# Patient Record
Sex: Female | Born: 1945 | Race: White | Hispanic: No | Marital: Married | Smoking: Former smoker
Health system: Southern US, Community
[De-identification: ages and names within clinical notes are randomized; demographics above are authoritative.]

## PROBLEM LIST (undated history)

## (undated) DIAGNOSIS — E785 Hyperlipidemia, unspecified: Secondary | ICD-10-CM

## (undated) DIAGNOSIS — M1990 Unspecified osteoarthritis, unspecified site: Secondary | ICD-10-CM

## (undated) DIAGNOSIS — G510 Bell's palsy: Secondary | ICD-10-CM

## (undated) DIAGNOSIS — R7301 Impaired fasting glucose: Secondary | ICD-10-CM

## (undated) DIAGNOSIS — E039 Hypothyroidism, unspecified: Secondary | ICD-10-CM

## (undated) DIAGNOSIS — I639 Cerebral infarction, unspecified: Secondary | ICD-10-CM

## (undated) DIAGNOSIS — H409 Unspecified glaucoma: Secondary | ICD-10-CM

## (undated) HISTORY — PX: EYE SURGERY: SHX253

## (undated) HISTORY — PX: THYROIDECTOMY: SHX17

## (undated) HISTORY — DX: Hyperlipidemia, unspecified: E78.5

## (undated) HISTORY — DX: Bell's palsy: G51.0

## (undated) HISTORY — DX: Unspecified glaucoma: H40.9

## (undated) HISTORY — DX: Hypothyroidism, unspecified: E03.9

## (undated) HISTORY — DX: Impaired fasting glucose: R73.01

---

## 1988-08-17 HISTORY — PX: OTHER SURGICAL HISTORY: SHX169

## 2005-05-20 ENCOUNTER — Ambulatory Visit: Payer: Self-pay | Admitting: Ophthalmology

## 2006-10-06 ENCOUNTER — Ambulatory Visit: Payer: Self-pay | Admitting: Nurse Practitioner

## 2008-12-06 ENCOUNTER — Ambulatory Visit: Payer: Self-pay | Admitting: Nurse Practitioner

## 2010-07-31 ENCOUNTER — Emergency Department: Payer: Self-pay | Admitting: Internal Medicine

## 2011-01-29 ENCOUNTER — Ambulatory Visit: Payer: Self-pay | Admitting: Family Medicine

## 2011-04-28 IMAGING — CT CT STONE STUDY
1 of 2 series · 15 of 32 positions shown, 19 images · non-contrast
Comparison: none

REASON FOR EXAM: ab dpain
COMMENTS:

[Series 2: stone · axial · 0.63mm/px · z∈[-944,-594]mm · 15 of 132 slices shown, 19 images]
[im 10/132  soft-tissue]
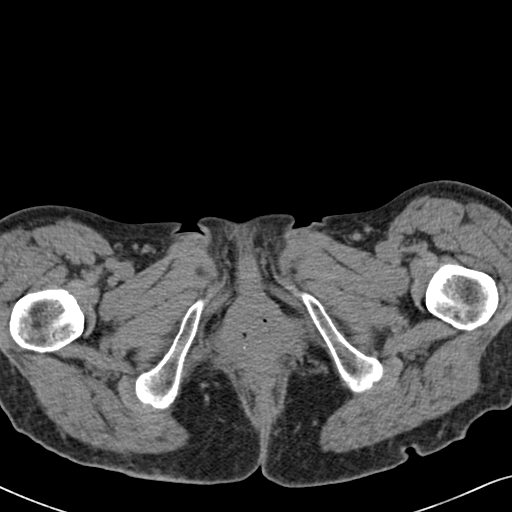
[im 10/132  bone]
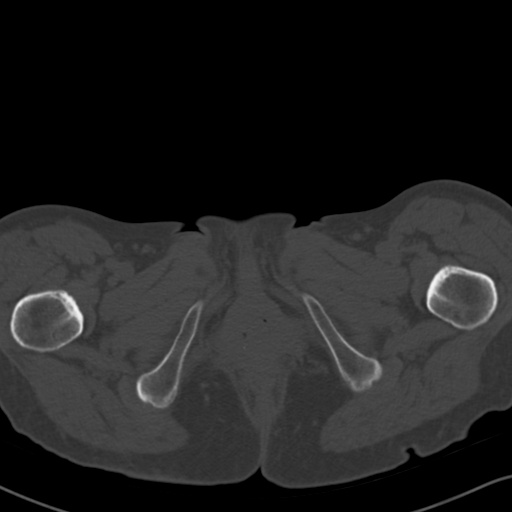
[im 19/132  soft-tissue]
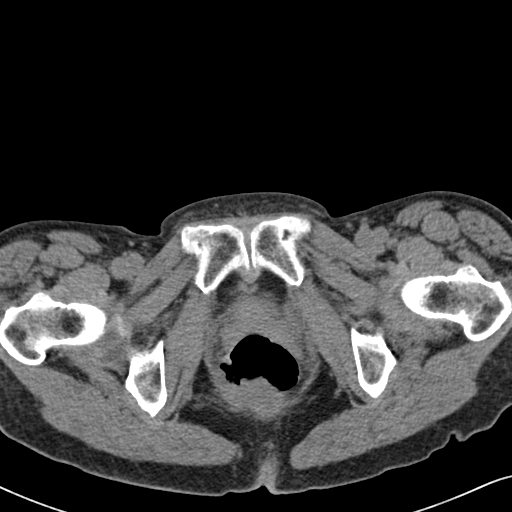
[im 29/132  soft-tissue]
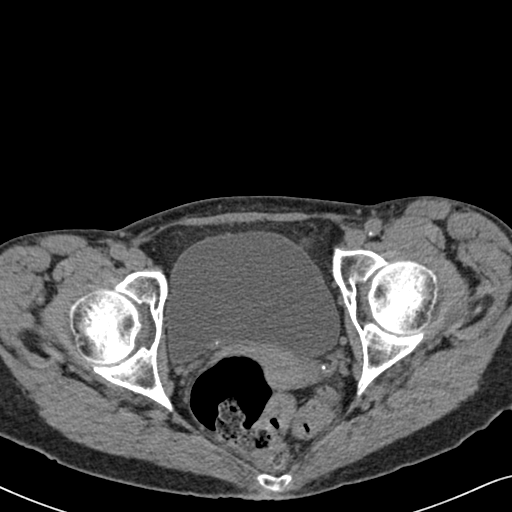
[im 38/132  soft-tissue]
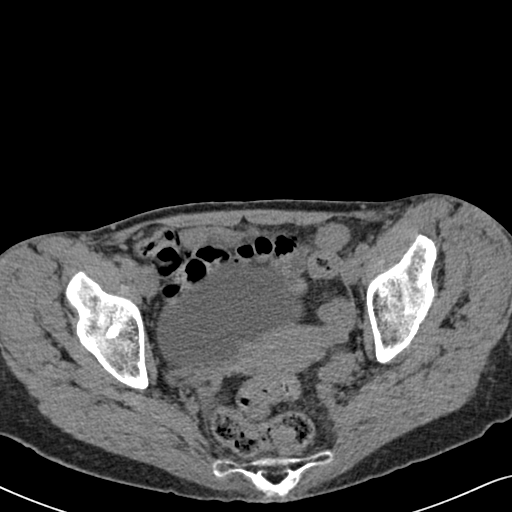
[im 47/132  soft-tissue]
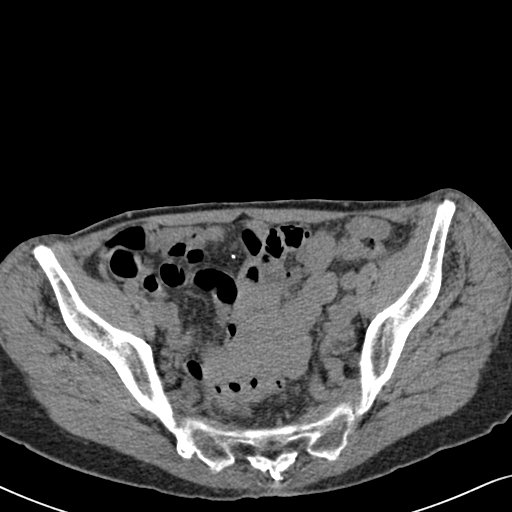
[im 57/132  soft-tissue]
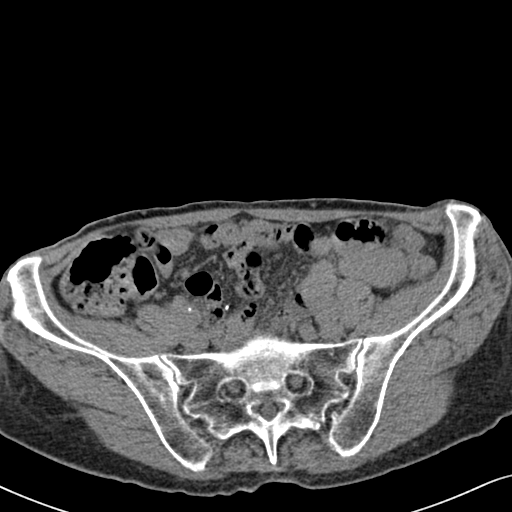
[im 66/132  soft-tissue]
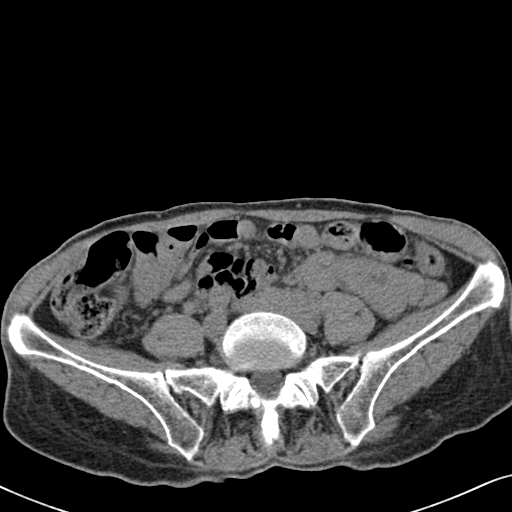
[im 75/132  soft-tissue]
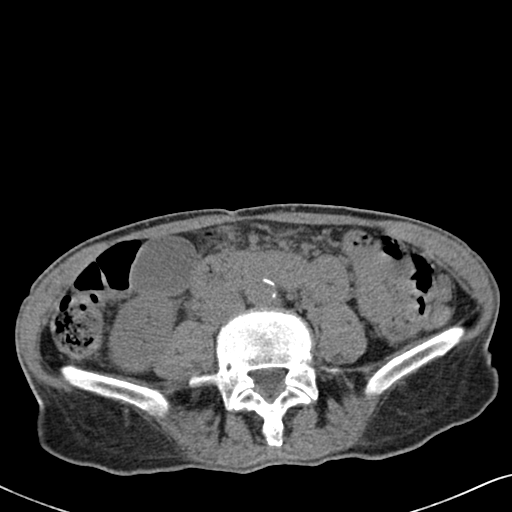
[im 85/132  soft-tissue]
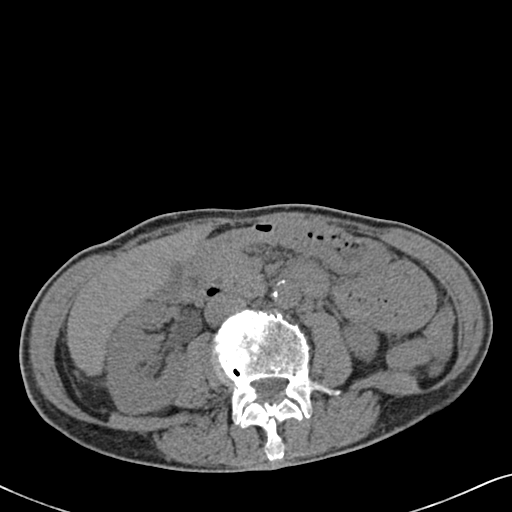
[im 85/132  bone]
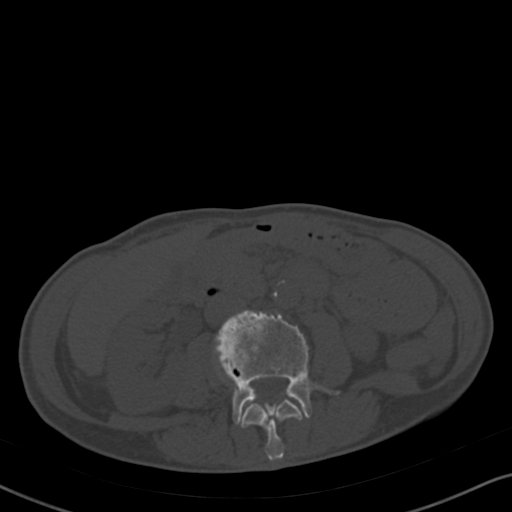
[im 94/132  soft-tissue]
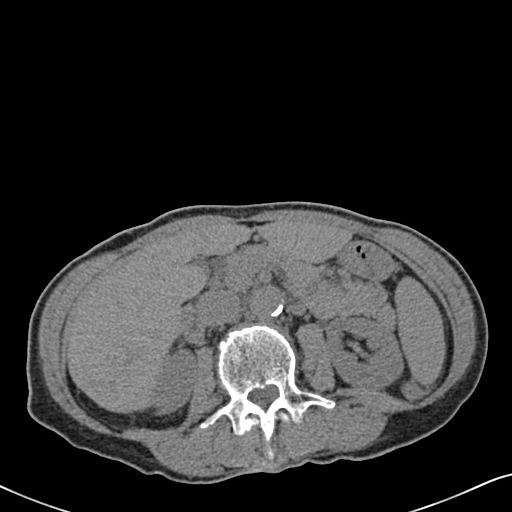
[im 103/132  soft-tissue]
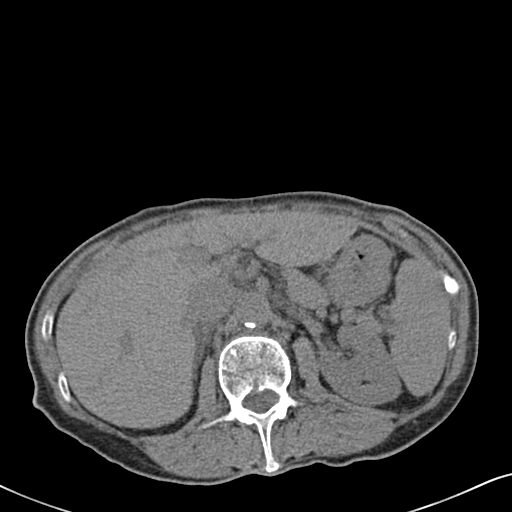
[im 113/132  soft-tissue]
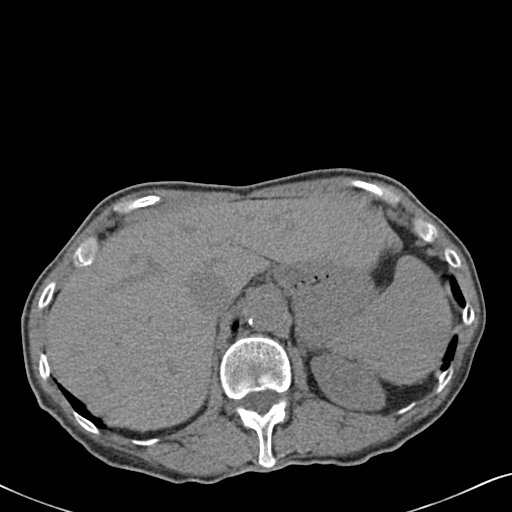
[im 113/132  lung]
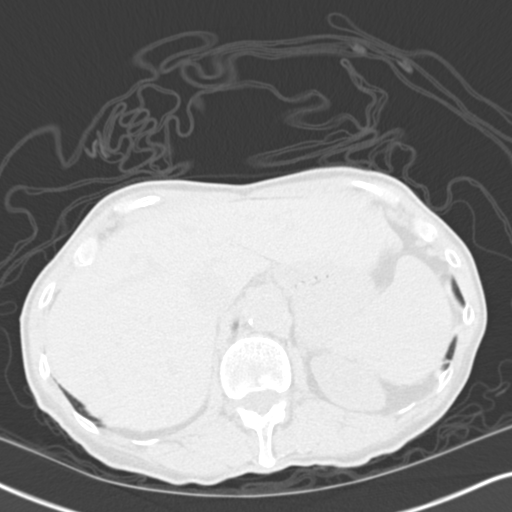
[im 117/132  lung]
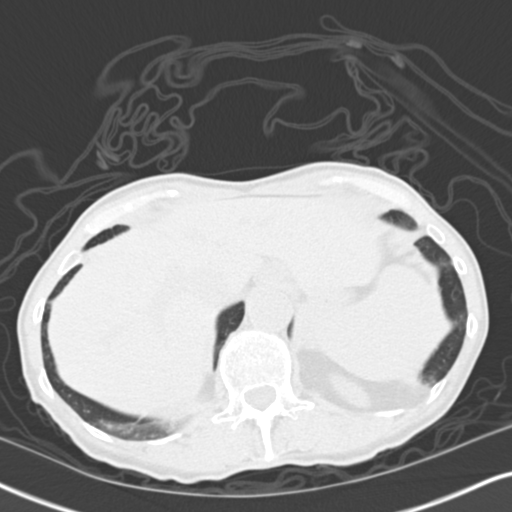
[im 122/132  soft-tissue]
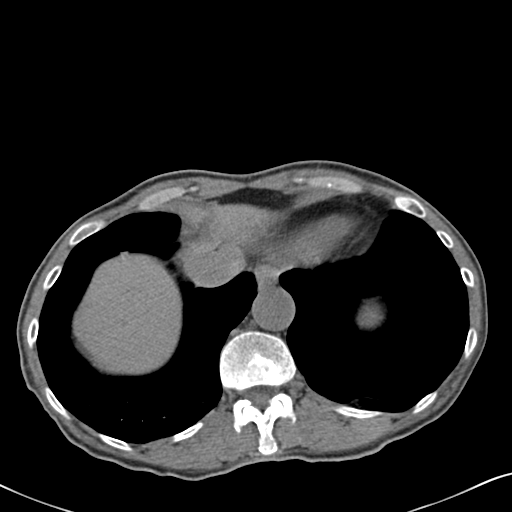
[im 122/132  lung]
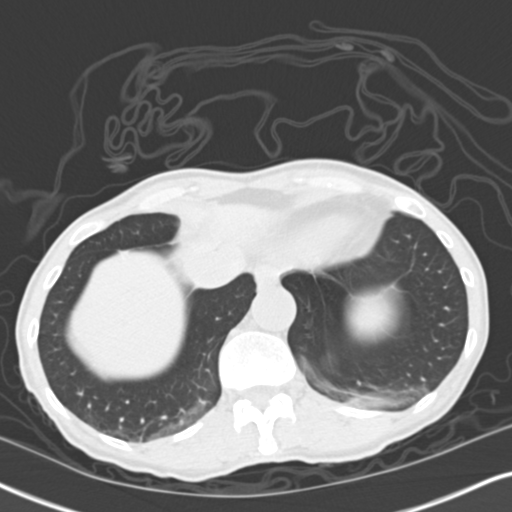
[im 127/132  lung]
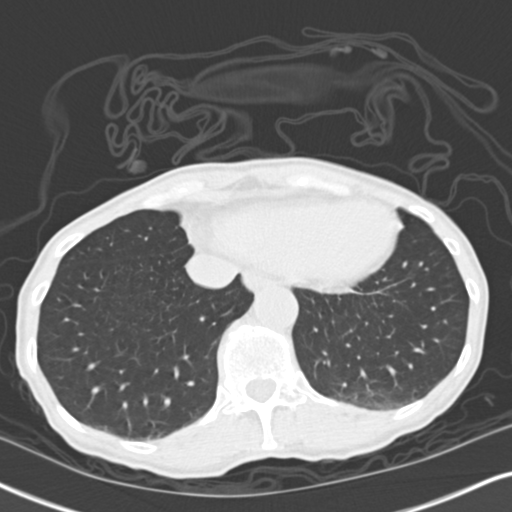

[15 of 32 positions shown; findings below may reference images not displayed]

PROCEDURE:     CT  - CT ABDOMEN /PELVIS WO (STONE)  - July 31, 2010 [DATE]

RESULT:     Axial noncontrast CT scanning was performed through the abdomen
and pelvis at 3 mm intervals and slice thicknesses. Review of multiplanar
reconstructed images was performed separately on the VIA monitor.

The kidneys are normal in contour. There is mild hydronephrosis on the right
with mild hydroureter. This is related to an approximately 1 mm diameter
stone at the right ureterovesical junction visible on image 104. I do not
see stones elsewhere in the right kidney. I see no stones in the left kidney
nor evidence of obstruction.

The liver, gallbladder, spleen, nondistended stomach, right adrenal gland,
and periaortic and pericaval regions exhibit no acute abnormality. There is
mild fullness of the left adrenal gland. The caliber of the abdominal aorta
is normal. The unopacified loops of small and large bowel are grossly
normal. The uterus and adnexal structures exhibit no acute.

The lung bases exhibit minimal compressive atelectasis in the posterior
costophrenic gutters. There are degenerative changes of the L3-L4 disc.
IMPRESSION: 1. There is mild hydronephrosis and hydroureter on the right secondary to a
1 mm diameter distal right ureteral stone.
2. Evaluation of the remainder of the abdominal viscera is limited without
oral or intravenous contrast material. No gross abnormality is identified.
3. There is minimal compressive atelectasis at both lung bases posteriorly.

## 2011-06-09 ENCOUNTER — Ambulatory Visit: Payer: Self-pay | Admitting: Family Medicine

## 2012-03-06 IMAGING — US US CAROTID DUPLEX BILAT
1 series · 17 of 24 positions shown · non-contrast
Comparison: none

REASON FOR EXAM: syncope orthostatic hypotension
COMMENTS:

PROCEDURE:     US  - US CAROTID DOPPLER BILATERAL  - June 09, 2011 [DATE]
RESULT:
TECHNIQUE: Grayscale, Duplex Doppler and color flow and SPECTRAL waveform
imaging was performed of the right and left carotid systems.

[Series 1: us carotid duplex bilat · 17 of 69 slices shown]
[im 1/69]
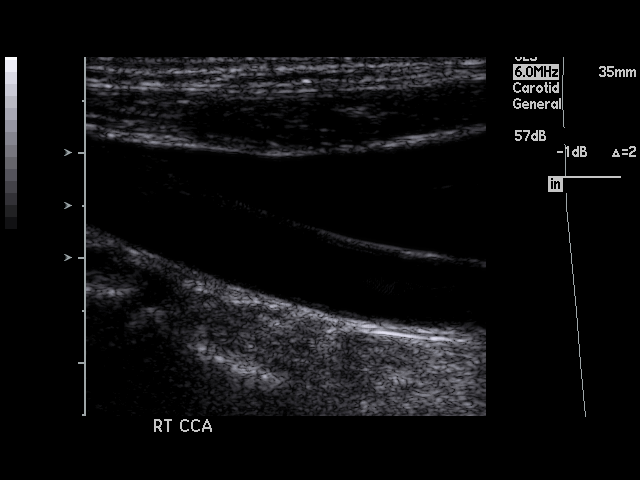
[im 6/69]
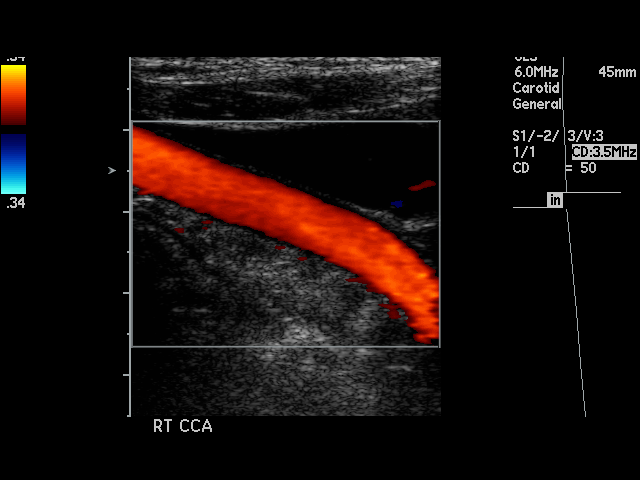
[im 9/69]
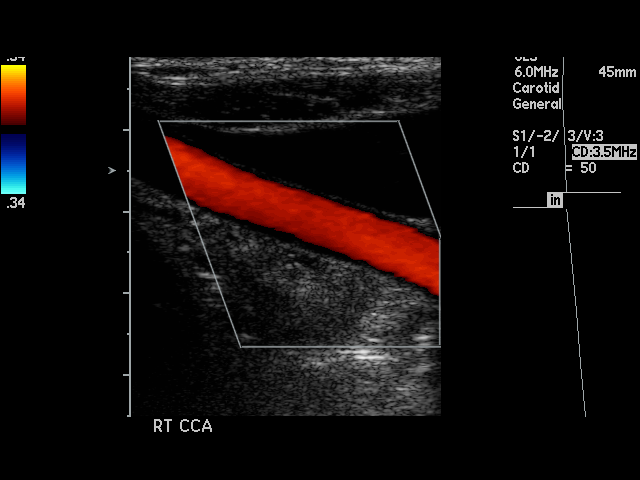
[im 12/69]
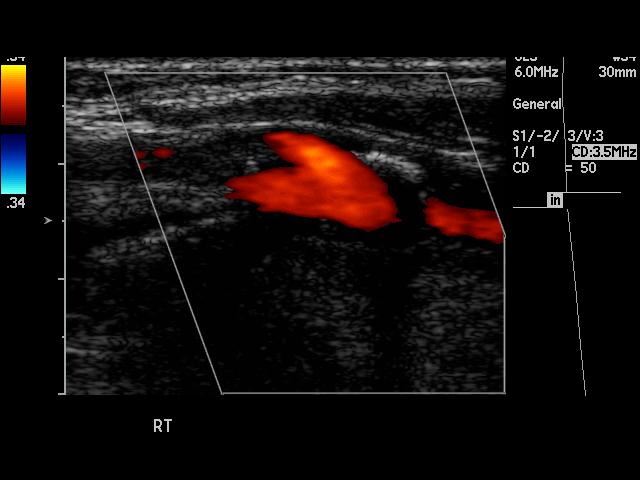
[im 18/69]
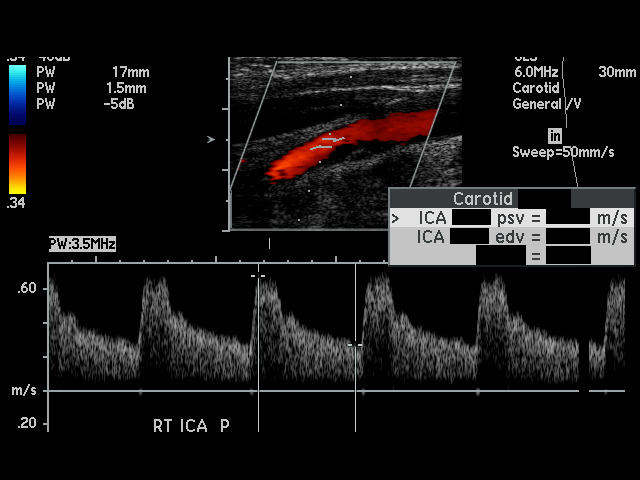
[im 21/69]
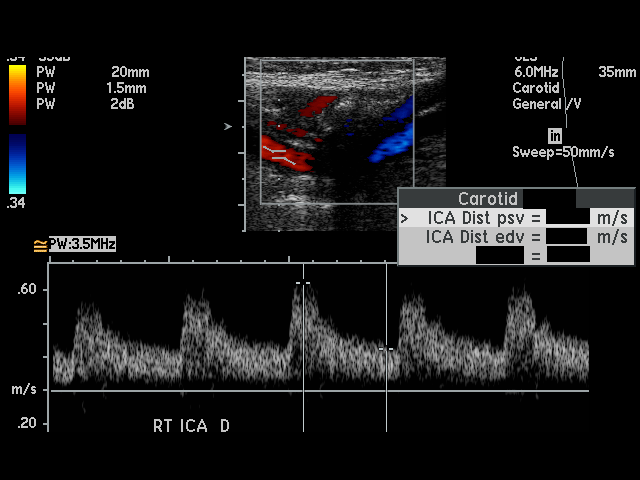
[im 27/69]
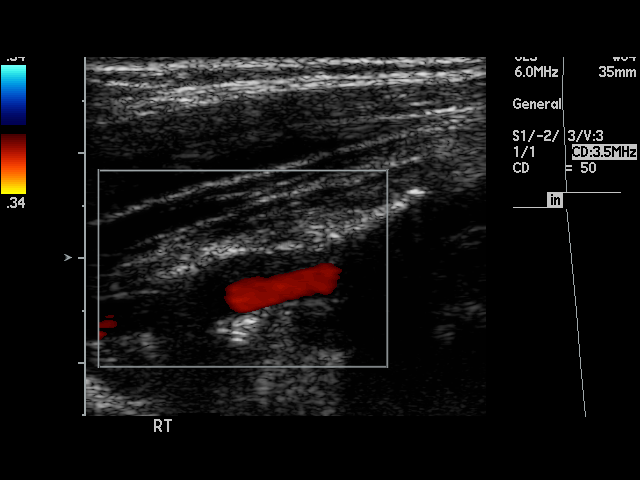
[im 30/69]
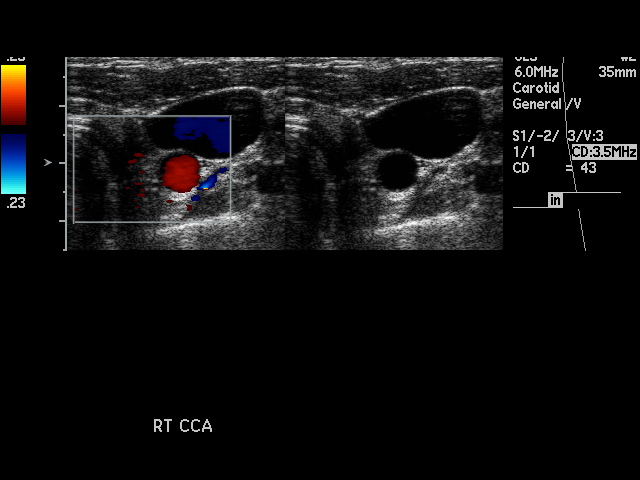
[im 36/69]
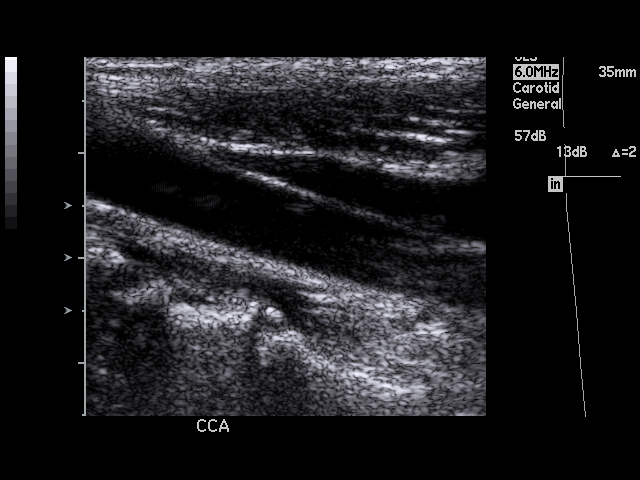
[im 39/69]
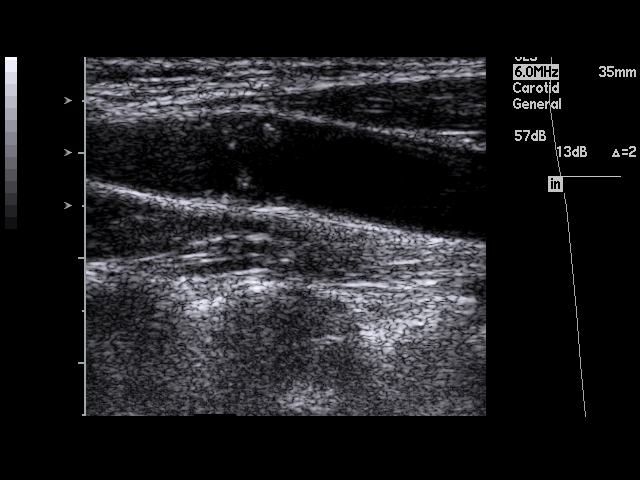
[im 42/69]
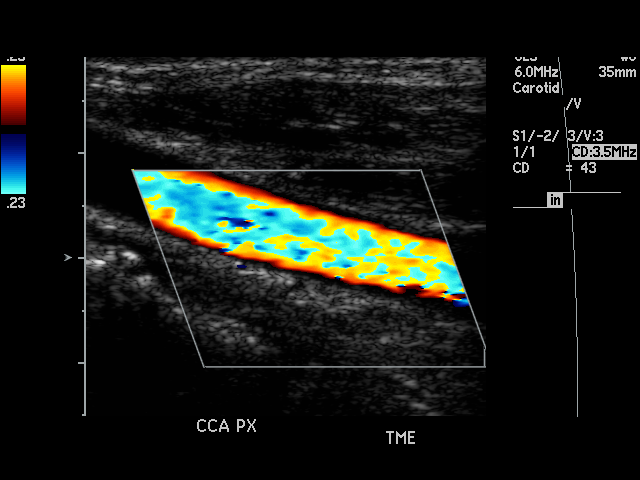
[im 48/69]
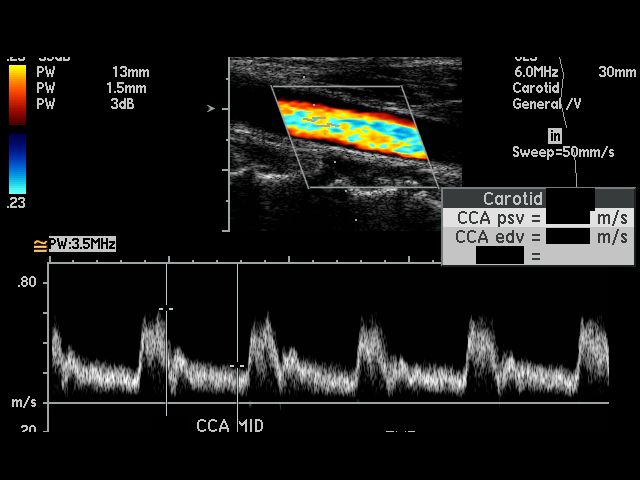
[im 51/69]
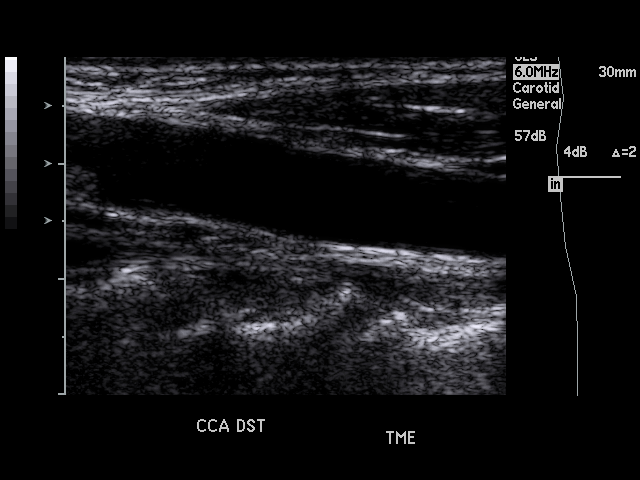
[im 57/69]
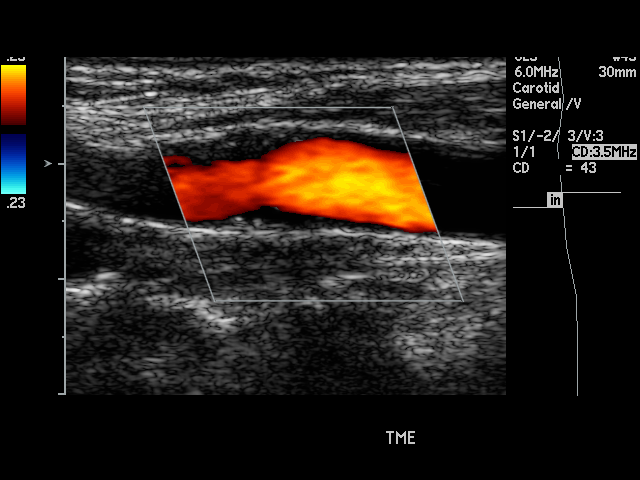
[im 60/69]
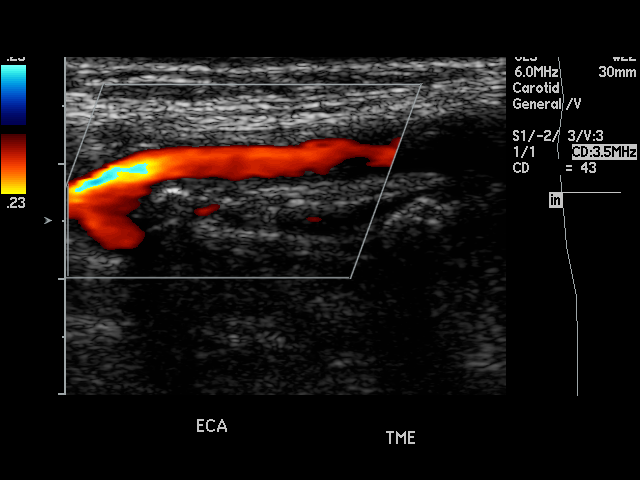
[im 63/69]
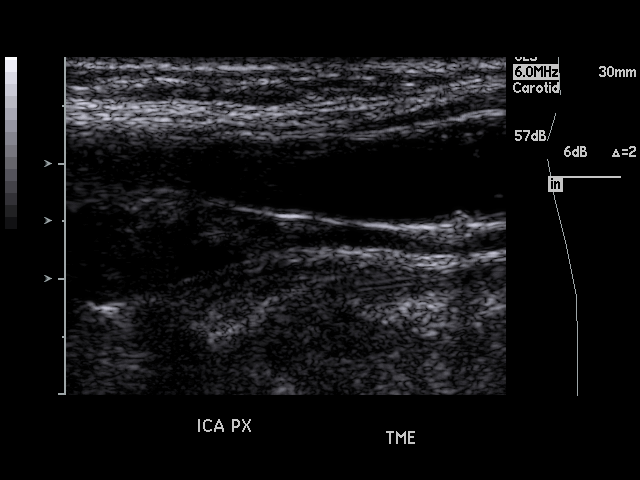
[im 69/69]
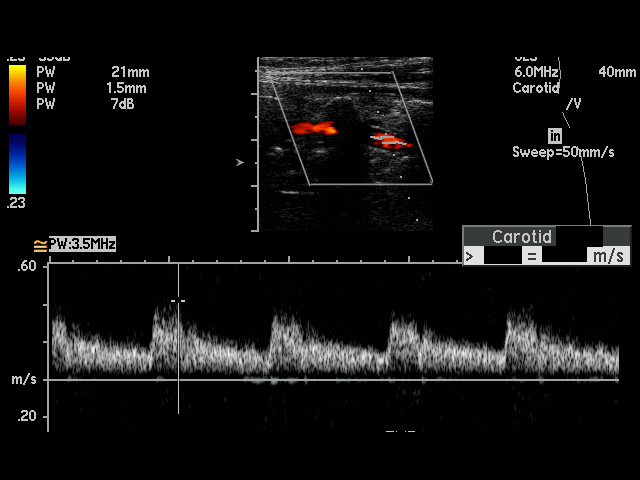

[17 of 24 positions shown; findings below may reference images not displayed]

FINDINGS: Visual evaluation of the right carotid system demonstrates
calcified plaque within the internal carotid artery and carotid bulb.
Calcified plaques are identified within the carotid bulb on the left. Color
flow and SPECTRAL waveform imaging is unremarkable within the right and left
carotid systems.

ICA/CCA ratios:

Right:
Left:

Antegrade flow is identified within the vertebral arteries.
IMPRESSION: No sonographic evidence of hemodynamically significant
stenosis.

## 2013-08-07 ENCOUNTER — Ambulatory Visit: Payer: Self-pay | Admitting: Physician Assistant

## 2013-11-29 ENCOUNTER — Ambulatory Visit: Payer: Self-pay | Admitting: Ophthalmology

## 2015-08-18 HISTORY — PX: CATARACT EXTRACTION: SUR2

## 2017-01-25 ENCOUNTER — Telehealth: Payer: Self-pay | Admitting: Surgery

## 2017-01-25 NOTE — Telephone Encounter (Signed)
Patient has been referred from Dr Ellison Hughs for a right supraclavicular mass.   I have called patient to make an appointment. Patient was not available at this time.   I have left a message with patient's husband for her to call and make an appointment.

## 2017-01-27 NOTE — Telephone Encounter (Signed)
Patient has called back and has made an appointment with Dr Dahlia Byes per referral on 02/01/17.

## 2017-01-28 ENCOUNTER — Other Ambulatory Visit: Payer: Self-pay

## 2017-01-28 DIAGNOSIS — E785 Hyperlipidemia, unspecified: Secondary | ICD-10-CM | POA: Insufficient documentation

## 2017-01-28 DIAGNOSIS — E039 Hypothyroidism, unspecified: Secondary | ICD-10-CM | POA: Insufficient documentation

## 2017-01-28 DIAGNOSIS — R7301 Impaired fasting glucose: Secondary | ICD-10-CM | POA: Insufficient documentation

## 2017-01-28 HISTORY — DX: Hypothyroidism, unspecified: E03.9

## 2017-01-28 HISTORY — DX: Hyperlipidemia, unspecified: E78.5

## 2017-01-28 HISTORY — DX: Impaired fasting glucose: R73.01

## 2017-02-01 ENCOUNTER — Ambulatory Visit (INDEPENDENT_AMBULATORY_CARE_PROVIDER_SITE_OTHER): Payer: Medicare HMO | Admitting: Surgery

## 2017-02-01 ENCOUNTER — Encounter: Payer: Self-pay | Admitting: Surgery

## 2017-02-01 VITALS — BP 120/71 | HR 83 | Temp 98.0°F | Ht 67.0 in | Wt 128.8 lb

## 2017-02-01 DIAGNOSIS — M799 Soft tissue disorder, unspecified: Secondary | ICD-10-CM | POA: Diagnosis not present

## 2017-02-01 DIAGNOSIS — M7989 Other specified soft tissue disorders: Secondary | ICD-10-CM

## 2017-02-01 NOTE — Progress Notes (Signed)
Surgical Consultation  02/01/2017  Erica Ingram is an 71 y.o. female.   Chief Complaint  Patient presents with  . New Patient (Initial Visit)    Supraclavicular Mass-referred Dr.Feldpausch     HPI: Pt Seen in consultation at the request of Dr. Ellison Hughs for a neck mass and shoulder mass. Patient reports that she has been noticing a right soft tissue mass on her neck for last couple months, this lesion does not hurt. Difficult to tell if he has increasing size. She also reports that on her left shoulder she has been soft tissue mass for several years and sometimes causes some mild intermittent dull pain. Apparently has remained same size for a while. No imaging studies available. No history of prior cancer or radiation therapy to the neck  Past Medical History:  Diagnosis Date  . Bell's palsy   . Elevated fasting blood sugar 01/28/2017  . Glaucoma   . Hyperlipidemia, unspecified 01/28/2017  . Hypothyroidism   . Hypothyroidism, unspecified 01/28/2017    Past Surgical History:  Procedure Laterality Date  . CATARACT EXTRACTION  2017  . EYE SURGERY    . lazer eye   1990    Family History  Problem Relation Age of Onset  . Heart attack Father   . Pancreatic cancer Sister     Social History:  reports that she quit smoking today. She has never used smokeless tobacco. She reports that she does not drink alcohol or use drugs.  Allergies: No Known Allergies  Medications reviewed.     ROS Full ROS performed and is otherwise negative other than what is stated in the HPI    BP 120/71   Pulse 83   Temp 98 F (36.7 C) (Oral)   Ht 5\' 7"  (1.702 m)   Wt 58.4 kg (128 lb 12.8 oz)   BMI 20.17 kg/m   Physical Exam  Constitutional: She is oriented to person, place, and time and well-developed, well-nourished, and in no distress. No distress.  Eyes: Right eye exhibits no discharge. Left eye exhibits no discharge. No scleral icterus.  Neck: Normal range of motion. No JVD  present. No tracheal deviation present. No thyromegaly present.  3x 3 cms soft mass on right lateral supraclavicular area, mobile and not tender. There is also a left shoulder soft tissue mass measuring 4x4 cms mobile and mildly tender  Cardiovascular: Normal rate, regular rhythm, normal heart sounds and intact distal pulses.   Pulmonary/Chest: Effort normal and breath sounds normal. No stridor. No respiratory distress. She has no wheezes.  Abdominal: Soft. She exhibits no distension. There is no tenderness. There is no rebound and no guarding.  Musculoskeletal: Normal range of motion. She exhibits no edema.  Lymphadenopathy:    She has no cervical adenopathy.  Neurological: She is alert and oriented to person, place, and time. Gait normal. GCS score is 15.  Skin: Skin is warm and dry. She is not diaphoretic.  Psychiatric: Mood, memory, affect and judgment normal.  Nursing note and vitals reviewed.    Assessment/Plan: Right neck soft tissue mass and left shoulder soft tissue mass consistent with symptomatic lipoma. Given its location and I will like to get a CT scan of the neck with contrast to rule out any potentially malignancy. Discussed with the patient in detail about my thought process and she understands. Also discussed with her that after the CT scan is performed we can talk about potentially excising versus clinical observation. She understands.  Caroleen Hamman, MD FACS General  Surgeon

## 2017-02-01 NOTE — Patient Instructions (Addendum)
We would like for you to have a CT of your neck.  Your appointment is 02/08/17 at [facility]  [place]   Nothing by mouth 4 hours prior to having the scan. Please see your follow up appointment with Dr.Pabon to discuss your results of the CT scan listed below.  Please call out office with questions or concerns.

## 2017-02-08 ENCOUNTER — Ambulatory Visit: Admission: RE | Admit: 2017-02-08 | Payer: Medicare HMO | Source: Ambulatory Visit

## 2017-02-16 ENCOUNTER — Other Ambulatory Visit: Payer: Self-pay | Admitting: Family Medicine

## 2017-02-16 DIAGNOSIS — Z1231 Encounter for screening mammogram for malignant neoplasm of breast: Secondary | ICD-10-CM

## 2017-02-23 ENCOUNTER — Ambulatory Visit
Admission: RE | Admit: 2017-02-23 | Discharge: 2017-02-23 | Disposition: A | Discharge status: Home / Self Care | Payer: Medicare HMO | Source: Ambulatory Visit | Attending: Surgery | Admitting: Surgery

## 2017-02-23 DIAGNOSIS — D170 Benign lipomatous neoplasm of skin and subcutaneous tissue of head, face and neck: Secondary | ICD-10-CM | POA: Insufficient documentation

## 2017-02-23 DIAGNOSIS — M799 Soft tissue disorder, unspecified: Secondary | ICD-10-CM | POA: Diagnosis present

## 2017-02-23 DIAGNOSIS — M7989 Other specified soft tissue disorders: Secondary | ICD-10-CM

## 2017-02-23 MED ORDER — IOPAMIDOL (ISOVUE-300) INJECTION 61%
75.0000 mL | Freq: Once | INTRAVENOUS | Status: AC | PRN
  Administered 2017-02-23: 75 mL via INTRAVENOUS

## 2017-02-24 ENCOUNTER — Ambulatory Visit (INDEPENDENT_AMBULATORY_CARE_PROVIDER_SITE_OTHER): Payer: Medicare HMO | Admitting: Surgery

## 2017-02-24 ENCOUNTER — Encounter: Payer: Self-pay | Admitting: Surgery

## 2017-02-24 VITALS — BP 128/72 | HR 73 | Temp 97.6°F | Ht 67.0 in | Wt 129.6 lb

## 2017-02-24 DIAGNOSIS — Z09 Encounter for follow-up examination after completed treatment for conditions other than malignant neoplasm: Secondary | ICD-10-CM

## 2017-02-24 NOTE — Patient Instructions (Signed)
We would like to follow up with you in 6 months. If you notice the masses are getting larger or painful please call our office and let us know.  Please see your follow up listed below.

## 2017-02-24 NOTE — Progress Notes (Signed)
F/U for neck mass and left shoulder mass Ct personally reviewed and d/w pt c/w lipomas D/w pt about options excision vs observation She wishes to observe F/U 6 months if grows more recommend excision I Spent 15 minutes in this encounter w greater than 50% used in coordination  Of care and counseling.

## 2017-03-03 ENCOUNTER — Encounter: Payer: Self-pay | Admitting: Radiology

## 2017-03-03 ENCOUNTER — Ambulatory Visit
Admission: RE | Admit: 2017-03-03 | Discharge: 2017-03-03 | Disposition: A | Discharge status: Home / Self Care | Payer: Medicare HMO | Source: Ambulatory Visit | Attending: Family Medicine | Admitting: Family Medicine

## 2017-03-03 DIAGNOSIS — Z1231 Encounter for screening mammogram for malignant neoplasm of breast: Secondary | ICD-10-CM | POA: Diagnosis not present

## 2017-04-28 NOTE — Discharge Instructions (Signed)

## 2017-05-05 ENCOUNTER — Ambulatory Visit
Admission: RE | Admit: 2017-05-05 | Discharge: 2017-05-05 | Disposition: A | Discharge status: Home / Self Care | Payer: Medicare HMO | Source: Ambulatory Visit | Attending: Ophthalmology | Admitting: Ophthalmology

## 2017-05-05 ENCOUNTER — Ambulatory Visit: Payer: Medicare HMO | Admitting: Anesthesiology

## 2017-05-05 ENCOUNTER — Encounter
Admission: RE | Disposition: A | Discharge status: Home / Self Care | Payer: Self-pay | Source: Ambulatory Visit | Attending: Ophthalmology

## 2017-05-05 DIAGNOSIS — H2512 Age-related nuclear cataract, left eye: Secondary | ICD-10-CM | POA: Diagnosis not present

## 2017-05-05 DIAGNOSIS — Z87891 Personal history of nicotine dependence: Secondary | ICD-10-CM | POA: Insufficient documentation

## 2017-05-05 DIAGNOSIS — E890 Postprocedural hypothyroidism: Secondary | ICD-10-CM | POA: Diagnosis not present

## 2017-05-05 DIAGNOSIS — H401120 Primary open-angle glaucoma, left eye, stage unspecified: Secondary | ICD-10-CM | POA: Diagnosis present

## 2017-05-05 HISTORY — DX: Unspecified osteoarthritis, unspecified site: M19.90

## 2017-05-05 HISTORY — PX: TRABECULECTOMY: SHX107

## 2017-05-05 HISTORY — PX: CATARACT EXTRACTION W/PHACO: SHX586

## 2017-05-05 SURGERY — PHACOEMULSIFICATION, CATARACT, WITH IOL INSERTION
Anesthesia: General | Laterality: Left | Wound class: Clean

## 2017-05-05 MED ORDER — NEOMYCIN-POLYMYXIN-DEXAMETH 3.5-10000-0.1 OP OINT
TOPICAL_OINTMENT | OPHTHALMIC | Status: DC | PRN
  Administered 2017-05-05: 1 via OPHTHALMIC

## 2017-05-05 MED ORDER — LIDOCAINE HCL (PF) 2 % IJ SOLN
INTRAOCULAR | Status: DC | PRN
  Administered 2017-05-05: 1 mL via INTRAOCULAR

## 2017-05-05 MED ORDER — MIDAZOLAM HCL 2 MG/2ML IJ SOLN
INTRAMUSCULAR | Status: DC | PRN
  Administered 2017-05-05: 2 mg via INTRAVENOUS

## 2017-05-05 MED ORDER — ALFENTANIL 500 MCG/ML IJ INJ
INJECTION | INTRAVENOUS | Status: DC | PRN
  Administered 2017-05-05: 1000 ug via INTRAVENOUS

## 2017-05-05 MED ORDER — LIDOCAINE HCL (PF) 4 % IJ SOLN
INTRAMUSCULAR | Status: DC | PRN
  Administered 2017-05-05: 5 mL via OPHTHALMIC

## 2017-05-05 MED ORDER — NA HYALUR & NA CHOND-NA HYALUR 0.4-0.35 ML IO KIT
PACK | INTRAOCULAR | Status: DC | PRN
  Administered 2017-05-05: 1 mL via INTRAOCULAR

## 2017-05-05 MED ORDER — MOXIFLOXACIN HCL 0.5 % OP SOLN
1.0000 [drp] | OPHTHALMIC | Status: DC | PRN
  Administered 2017-05-05 (×3): 1 [drp] via OPHTHALMIC

## 2017-05-05 MED ORDER — MITOMYCIN 0.2 MG OP KIT
PACK | OPHTHALMIC | Status: DC | PRN
  Administered 2017-05-05: .04 mL via OPHTHALMIC

## 2017-05-05 MED ORDER — ARMC OPHTHALMIC DILATING DROPS
1.0000 "application " | OPHTHALMIC | Status: DC | PRN
  Administered 2017-05-05 (×3): 1 via OPHTHALMIC

## 2017-05-05 MED ORDER — CEFUROXIME OPHTHALMIC INJECTION 1 MG/0.1 ML
INJECTION | OPHTHALMIC | Status: DC | PRN
Start: 2017-05-05 — End: 2017-05-05
  Administered 2017-05-05: 0.1 mL via INTRACAMERAL

## 2017-05-05 MED ORDER — LACTATED RINGERS IV SOLN: 10.0000 mL/h | INTRAVENOUS | Status: DC

## 2017-05-05 SURGICAL SUPPLY — 37 items
BANDAGE EYE OVAL (MISCELLANEOUS) ×4 IMPLANT
BLADE MINI RND TIP GREEN BEAV (BLADE) ×2 IMPLANT
CANNULA ANT/CHMB 27GA (MISCELLANEOUS) ×4 IMPLANT
CORD BIP STRL DISP 12FT (MISCELLANEOUS) ×2 IMPLANT
CUP MEDICINE 2OZ PLAST GRAD ST (MISCELLANEOUS) ×2 IMPLANT
GLOVE BIO SURGEON STRL SZ7.5 (GLOVE) ×2 IMPLANT
GLOVE SURG LX 7.5 STRW (GLOVE) ×1
GLOVE SURG LX STRL 7.5 STRW (GLOVE) ×1 IMPLANT
GLOVE SURG TRIUMPH 8.0 PF LTX (GLOVE) ×2 IMPLANT
GOWN STRL REUS W/ TWL LRG LVL3 (GOWN DISPOSABLE) ×2 IMPLANT
GOWN STRL REUS W/TWL LRG LVL3 (GOWN DISPOSABLE) ×2
KNIFE SIDECUT EYE (MISCELLANEOUS) IMPLANT
LENS IOL TECNIS ITEC 23.5 (Intraocular Lens) ×2 IMPLANT
MARKER SKIN DUAL TIP RULER LAB (MISCELLANEOUS) ×2 IMPLANT
NDL RETROBULBAR .5 NSTRL (NEEDLE) ×2 IMPLANT
NEEDLE FILTER BLUNT 18X 1/2SAF (NEEDLE) ×2
NEEDLE FILTER BLUNT 18X1 1/2 (NEEDLE) ×2 IMPLANT
NEEDLE HYPO 26X3/8 (NEEDLE) ×2 IMPLANT
PACK CATARACT BRASINGTON (MISCELLANEOUS) ×2 IMPLANT
PACK EYE AFTER SURG (MISCELLANEOUS) ×2 IMPLANT
PACK OPTHALMIC (MISCELLANEOUS) ×2 IMPLANT
PROTECTOR LASIK FLAP (MISCELLANEOUS) ×2 IMPLANT
RING MALYGIN (MISCELLANEOUS) ×2 IMPLANT
SHUNT EXPRESS GLAUCOMA MINI (Shunt) ×2 IMPLANT
SOLUTION OPHTHALMIC SALT (MISCELLANEOUS) ×4 IMPLANT
SPONGE SURG I SPEAR (MISCELLANEOUS) ×6 IMPLANT
SUT ETHILON 10-0 CS-B-6CS-B-6 (SUTURE) ×4
SUT VICRYL  9 0 (SUTURE) ×1
SUT VICRYL 9 0 (SUTURE) ×1 IMPLANT
SUTURE EHLN 10-0 CS-B-6CS-B-6 (SUTURE) ×2 IMPLANT
SYR 3ML LL SCALE MARK (SYRINGE) ×2 IMPLANT
SYR 5ML LL (SYRINGE) ×2 IMPLANT
SYR TB 1ML LUER SLIP (SYRINGE) ×2 IMPLANT
SYRINGE 10CC LL (SYRINGE) ×2 IMPLANT
WATER STERILE IRR 250ML POUR (IV SOLUTION) ×2 IMPLANT
WATER STERILE IRR 500ML POUR (IV SOLUTION) ×2 IMPLANT
WIPE NON LINTING 3.25X3.25 (MISCELLANEOUS) ×2 IMPLANT

## 2017-05-05 NOTE — Op Note (Addendum)
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   Nuclear sclerotic cataract left eye. H25.12    PROCEDURE PERFORMED: Trabeculectomy with mitomycin C and placement of Express shunt left eye. Phacoemusification with posterior chamber intraocular lens placement of the left eye with Malyugin ring    IMPLANT:   Implant Name Type Inv. Item Serial No. Manufacturer Lot No. LRB No. Used  SHUNT EXPRESS GLAUCOMA MINI - [id number] Shunt SHUNT EXPRESS GLAUCOMA MINI  ALCON 13086 Left 1  LENS IOL DIOP 23.5 - [id number] Intraocular Lens LENS IOL DIOP 23.5 [id number] AMO   Left 1    ULTRASOUND TIME: 19  % of 1 minutes 26 seconds, CDE 16.6  SURGEON:  Wyonia Hough, MD   ANESTHESIA: Retrobulbar block of Xylocaine and bupivacaine administered under intravenous sedation plus 1% preservative-free intracameral lidocaine.  ESTIMATED BLOOD LOSS: Less than 1 mL.   COMPLICATIONS: None.   DESCRIPTION OF PROCEDURE: The patient was identified in the holding room and transported to the operative suite and placed in the supine position underneath the operating microscope. The left eye was identified as the operative eye and a retrobulbar block and lid block of Xylocaine and bupivacaine were administered under intravenous sedation. It was then prepped and draped in the usual sterile ophthalmic fashion.   A 1 millimeter clear-corneal paracentesis was made at the 4:30 position.  0.5 ml of preservative-free 1% lidocaine was injected into the anterior chamber.  The anterior chamber was filled with Viscoat viscoelastic.  A 2.4 millimeter keratome was used to make a near-clear corneal incision at the 1:30 position.  A Malyugin ring was inserted and posterior synechia were lysed.A  curvilinear capsulorrhexis was made with a cystotome and capsulorrhexis forceps.  Balanced salt solution was used to hydrodissect and hydrodelineate the nucleus.   Phacoemulsification was then used in stop and chop fashion to remove the lens nucleus and epinucleus.  The remaining cortex was then removed using the irrigation and aspiration handpiece. Provisc was then placed into the capsular bag to distend it for lens placement.  A lens was then injected into the capsular bag. The Malyugin ring was removed.   The remaining viscoelastic was aspirated.   Wounds were hydrated with balanced salt solution.  The anterior chamber was inflated to a physiologic pressure with balanced salt solution.  No wound leaks were noted.  A 10-0 nylon suture was placed through the 2.4 mm incision.   Mitomycin 0.04% mixed with 2% lidocaine was injected subconjunctivally.  A conjunctival peritomy was made from the 10 o'clock to the 12 o'clock position with Westcott scissors approximately 2 mm posterior to the limbus. Hemostasis was achieved with Wet-Field cautery. This area was copiously irrigated with balanced salt solution.  Careful dissection of the tenons and conjunctiva were done using Westcott scissors.  A partial-thickness trapezoidal-shaped scleral flap was made at the 11 o'clock position. This was dissected forward to clear cornea. A 26-gauge needle was then used to enter the anterior chamber parallel to the iris underneath the anterior portion of the scleral flap. An Express shunt version P50 was then placed through the opening into the anterior chamber. This was noted to be parallel with the iris without corneal or iris touch. The flap was sutured initially with two posterior 10-0 nylon sutures. The anterior chamber was  filled with balanced salt solution to remove the remaining Provisc. An additional 2 10-0 nylon sutures were placed on the sides of the flap. There was adequate but not excess spontaneous flow from the flap.  The conjunctiva was then closed with running 9-0 Vicryl suture.   The paracentesis incision was hydrated with balanced salt solution to ensure a tight wound without wound leak. Cefuroxime 0.1 ml of a 10mg /ml solution was injected into the anterior chamber for a dose of 1 mg of intracameral antibiotic at the completion of the case. The eye was inflated above physiologic pressure. Spontaneous bleb formation was noted. There was no conjunctival wound leak with bleb formation. Topical  erythromycin ointment was applied to the eye. The eye was patched and shielded. The patient was taken to the recovery room in stable condition.  Erica Ingram 05/05/2017, 12:02 PM

## 2017-05-05 NOTE — Anesthesia Preprocedure Evaluation (Signed)
Anesthesia Evaluation  Patient identified by MRN, date of birth, ID band Patient awake    Reviewed: Allergy & Precautions, H&P , NPO status , Patient's Chart, lab work & pertinent test results, reviewed documented beta blocker date and time   Airway Mallampati: II  TM Distance: >3 FB Neck ROM: full    Dental no notable dental hx.    Pulmonary neg pulmonary ROS, former smoker,    Pulmonary exam normal breath sounds clear to auscultation       Cardiovascular Exercise Tolerance: Good negative cardio ROS   Rhythm:regular Rate:Normal     Neuro/Psych Bell's palsy negative psych ROS   GI/Hepatic negative GI ROS, Neg liver ROS,   Endo/Other  Hypothyroidism   Renal/GU negative Renal ROS  negative genitourinary   Musculoskeletal   Abdominal   Peds  Hematology negative hematology ROS (+)   Anesthesia Other Findings   Reproductive/Obstetrics negative OB ROS                             Anesthesia Physical Anesthesia Plan  ASA: II  Anesthesia Plan: General   Post-op Pain Management:    Induction:   PONV Risk Score and Plan:   Airway Management Planned:   Additional Equipment:   Intra-op Plan:   Post-operative Plan:   Informed Consent: I have reviewed the patients History and Physical, chart, labs and discussed the procedure including the risks, benefits and alternatives for the proposed anesthesia with the patient or authorized representative who has indicated his/her understanding and acceptance.   Dental Advisory Given  Plan Discussed with: CRNA  Anesthesia Plan Comments:         Anesthesia Quick Evaluation

## 2017-05-05 NOTE — Anesthesia Procedure Notes (Signed)
Procedure Name: General with mask airway Performed by: Londell Moh Pre-anesthesia Checklist: Patient identified, Emergency Drugs available, Suction available, Timeout performed and Patient being monitored Patient Re-evaluated:Patient Re-evaluated prior to induction Oxygen Delivery Method: Circle system utilized Preoxygenation: Pre-oxygenation with 100% oxygen Induction Type: Inhalational induction Ventilation: Mask ventilation without difficulty and Mask ventilation throughout procedure Dental Injury: Teeth and Oropharynx as per pre-operative assessment

## 2017-05-05 NOTE — H&P (Signed)
The History and Physical notes are on paper, have been signed, and are to be scanned. The patient remains stable and unchanged from the H&P.   Previous H&P reviewed, patient examined, and there are no changes.  Mourad Cwikla 05/05/2017 10:10 AM

## 2017-05-05 NOTE — Anesthesia Postprocedure Evaluation (Signed)
Anesthesia Post Note  Patient: Erica Ingram  Procedure(s) Performed: Procedure(s) (LRB): CATARACT EXTRACTION PHACO AND INTRAOCULAR LENS PLACEMENT (IOC) LEFT COMPLICATED (Left) TRABECULECTOMY WITH [facility] AND EXPRESS SHUNT (Left)  Patient location during evaluation: PACU Anesthesia Type: General Level of consciousness: awake and alert Pain management: pain level controlled Vital Signs Assessment: post-procedure vital signs reviewed and stable Respiratory status: spontaneous breathing, nonlabored ventilation, respiratory function stable and patient connected to nasal cannula oxygen Cardiovascular status: blood pressure returned to baseline and stable Postop Assessment: no apparent nausea or vomiting Anesthetic complications: no    Shawne Eskelson [place]

## 2017-05-05 NOTE — Transfer of Care (Signed)
Immediate Anesthesia Transfer of Care Note  Patient: Erica Ingram  Procedure(s) Performed: Procedure(s): CATARACT EXTRACTION PHACO AND INTRAOCULAR LENS PLACEMENT (IOC) LEFT COMPLICATED (Left) TRABECULECTOMY WITH [facility] AND EXPRESS SHUNT (Left)  Patient Location: PACU  Anesthesia Type: General  Level of Consciousness: awake, alert  and patient cooperative  Airway and Oxygen Therapy: Patient Spontanous Breathing and Patient connected to supplemental oxygen  Post-op Assessment: Post-op Vital signs reviewed, Patient's Cardiovascular Status Stable, Respiratory Function Stable, Patent Airway and No signs of Nausea or vomiting  Post-op Vital Signs: Reviewed and stable  Complications: No apparent anesthesia complications

## 2017-05-06 ENCOUNTER — Encounter: Payer: Self-pay | Admitting: Ophthalmology

## 2017-07-14 ENCOUNTER — Telehealth: Payer: Self-pay

## 2017-07-14 NOTE — Telephone Encounter (Signed)
Patient states at this time that the clavicle mass is soft and not causing any problems. She stated Dr.Pabon told her as long as it remains soft and no problems that she did not need to follow up.  Patient cancelled her appointment 07/28/17.

## 2017-07-28 ENCOUNTER — Ambulatory Visit: Payer: Medicare HMO | Admitting: Surgery

## 2017-07-29 ENCOUNTER — Ambulatory Visit: Payer: Medicare HMO | Admitting: Surgery

## 2017-11-21 IMAGING — CT CT NECK W/ CM
2 of 3 series · 8 of 14 positions shown, 9 images · IV contrast (iopamidol)
Comparison: None.

CLINICAL DATA: RIGHT supraclavicular swelling for 6 months. History
of thyroidectomy.

EXAM:
CT NECK WITH CONTRAST
TECHNIQUE: Multidetector CT imaging of the neck was performed using the
standard protocol following the bolus administration of intravenous
contrast.
CONTRAST:  75mL J85XZK-TII IOPAMIDOL (J85XZK-TII) INJECTION 61%

[Series 2: axial neck · axial · 0.53mm/px · z∈[-318,-158]mm · 4 of 134 slices shown]
[im 27/134  bone]
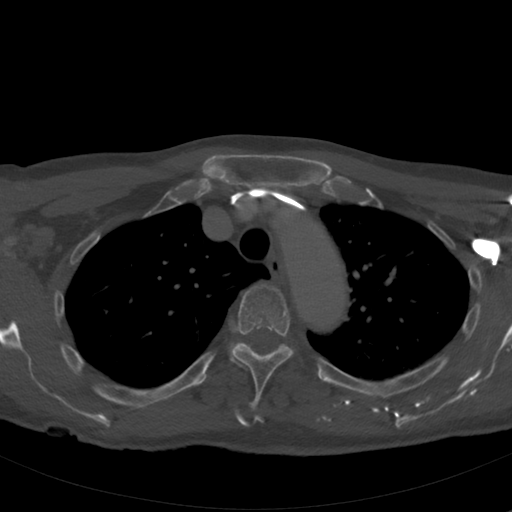
[im 54/134  bone]
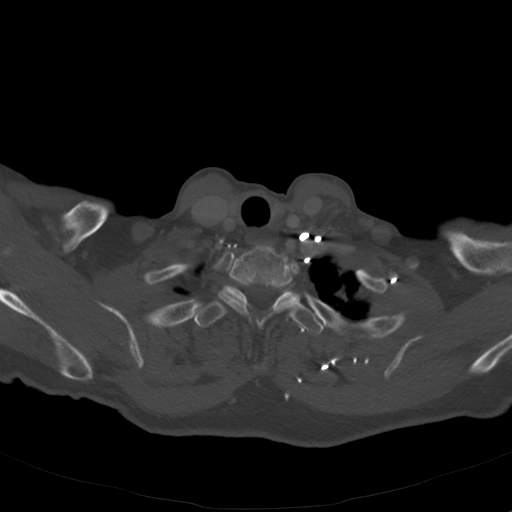
[im 80/134  bone]
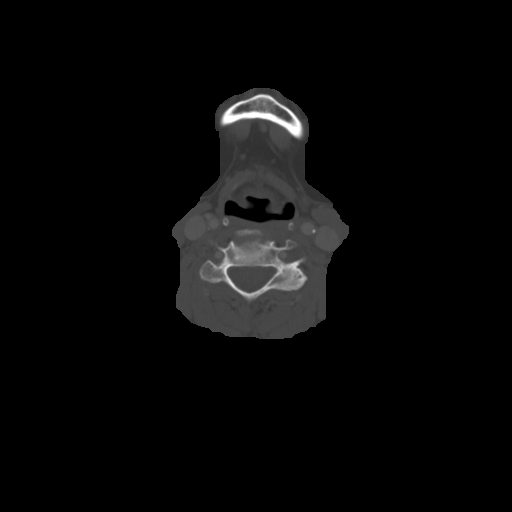
[im 107/134  bone]
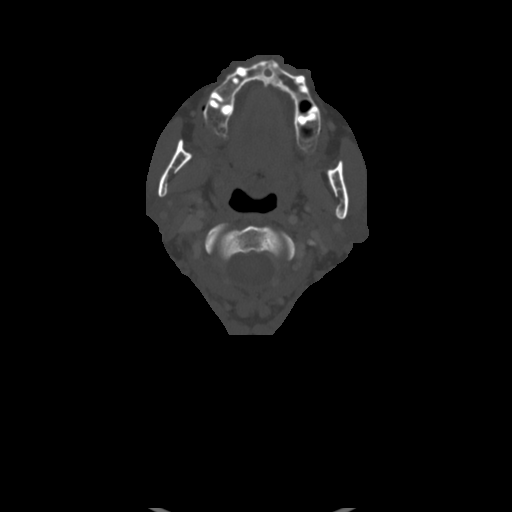

[Series 8: orthogonal ax · axial · 0.44mm/px · z∈[-336,-159]mm · 4 of 144 slices shown, 5 images]
[im 29/144  soft-tissue]
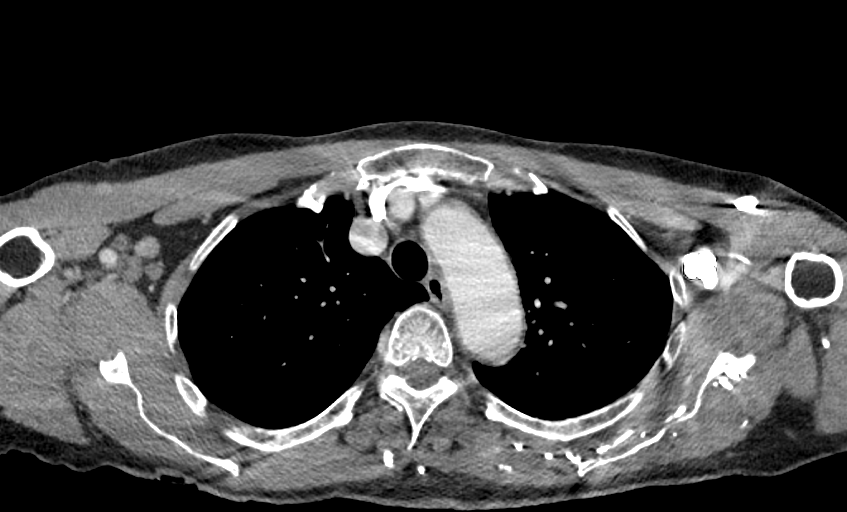
[im 29/144  bone]
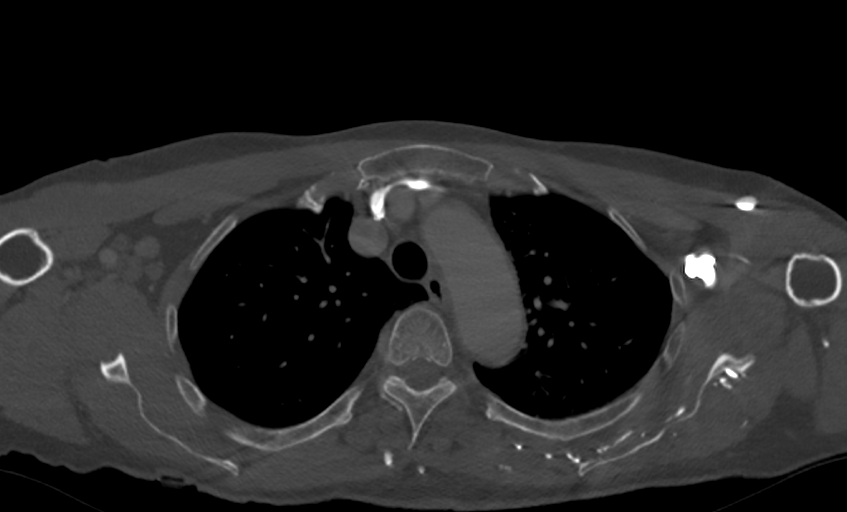
[im 58/144  bone]
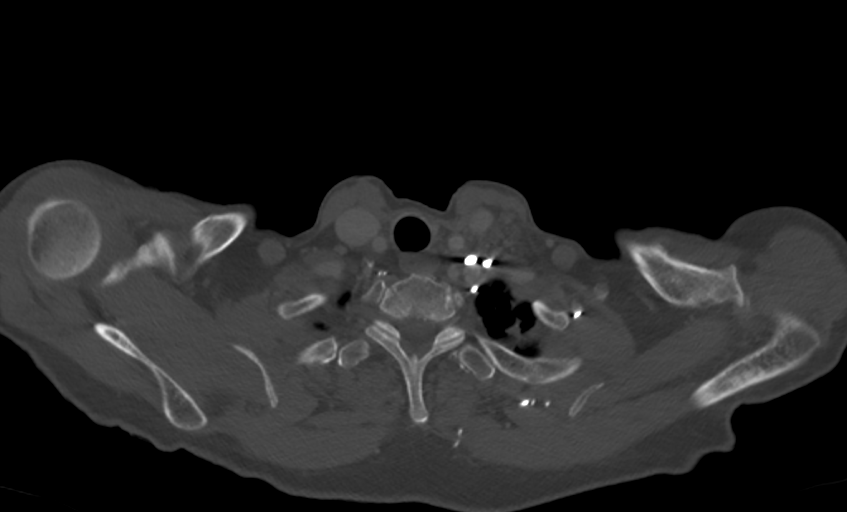
[im 86/144  bone]
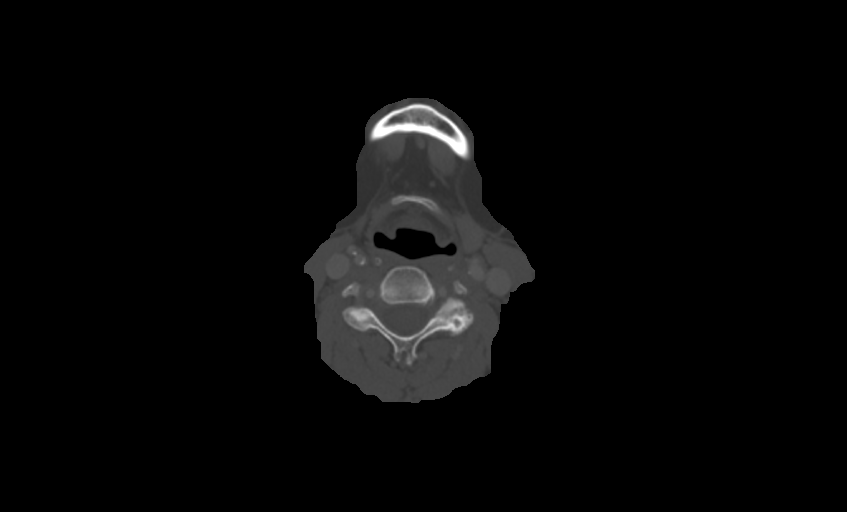
[im 115/144  bone]
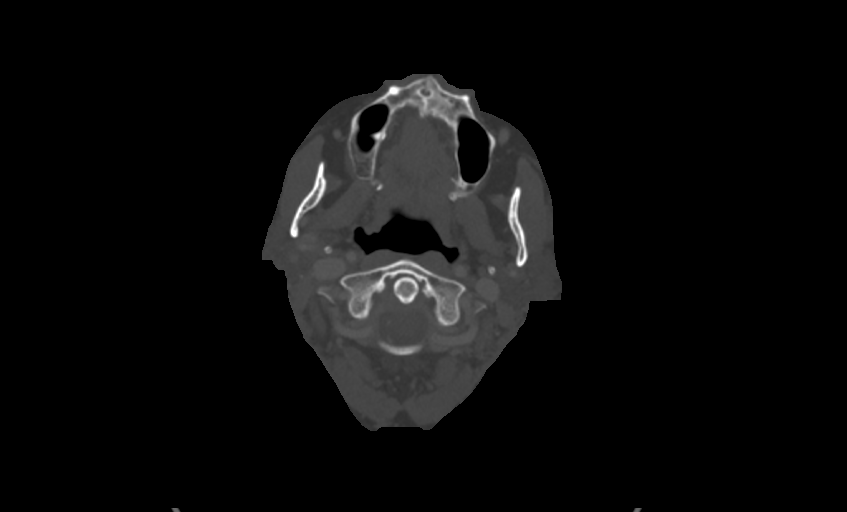

[8 of 14 positions shown; findings below may reference images not displayed]

FINDINGS: PHARYNX AND LARYNX: Normal. Widely patent airway. Apposition of the
vocal cords seen with phonation.

SALIVARY GLANDS: Normal. Punctate RIGHT submandibular sialolith
versus vessel.

THYROID: Reported thyroidectomy, small amount of thyroid tissue
within the bilateral thyroid bed without nodule.

LYMPH NODES: No lymphadenopathy by CT size criteria.

VASCULAR: Mild calcific atherosclerosis of the aortic arch. Moderate
calcific atherosclerosis carotid bifurcations, cervical vessels are
patent.

LIMITED INTRACRANIAL: Normal.

VISUALIZED ORBITS: Status post RIGHT ocular lens implant.

MASTOIDS AND VISUALIZED PARANASAL SINUSES: Well-aerated.

SKELETON: Nonacute. Severe C5-6 and C6-7 degenerative discs with
minimal grade 1 C4-5 anterolisthesis. Moderate to severe C5-6 and C6
narrowing.

UPPER CHEST: Lung apices are clear. No superior mediastinal
lymphadenopathy.

OTHER: 2.7 cm homogeneous fatty mass RIGHT supraclavicular fossa
corresponding to palpable abnormality, anteromedial to the trapezius
muscle.
IMPRESSION: 2.7 cm RIGHT supraclavicular lipoma corresponding to palpable
abnormality.

No acute process or suspicious findings in the neck.

## 2018-08-18 ENCOUNTER — Ambulatory Visit
Admission: EM | Admit: 2018-08-18 | Discharge: 2018-08-18 | Disposition: A | Discharge status: Home / Self Care | Payer: Medicare HMO | Attending: Family Medicine | Admitting: Family Medicine

## 2018-08-18 ENCOUNTER — Encounter: Payer: Self-pay | Admitting: Gynecology

## 2018-08-18 ENCOUNTER — Other Ambulatory Visit: Payer: Self-pay

## 2018-08-18 DIAGNOSIS — J019 Acute sinusitis, unspecified: Secondary | ICD-10-CM | POA: Diagnosis not present

## 2018-08-18 MED ORDER — AMOXICILLIN-POT CLAVULANATE 875-125 MG PO TABS: 1.0000 | ORAL_TABLET | Freq: Two times a day (BID) | ORAL | 0 refills | Status: DC

## 2018-08-18 NOTE — ED Triage Notes (Signed)
Patient c/o sinus infection x 3 weeks.

## 2018-08-18 NOTE — ED Provider Notes (Signed)
[facility]    CSN: [id number] Arrival date & time: 08/18/18  0848  History   Chief Complaint Sinus problem  HPI  73 year old female presents with sinus issues.  Patient reports ongoing sinus pressure, pain, and congestion since the middle of November.  She saw her primary on 11/15.  Was thought to be related to allergies but she has not improved with over-the-counter treatment with Allegra.  Patient continues to have sinus pressure and pain particularly around her eyes and in the maxillary region.  Reports green nasal discharge.  Reports subjective fever.  She has not taken her temperature.  No relief with Allegra.  Symptoms are severe.  No other associated symptoms.  No other complaints or concerns at this time.  PMH, Surgical Hx, Family Hx, Social History reviewed and updated as below.  Past Medical History:  Diagnosis Date  . Arthritis    hands  . Bell's palsy   . Elevated fasting blood sugar 01/28/2017  . Glaucoma   . Hyperlipidemia, unspecified 01/28/2017  . Hypothyroidism   . Hypothyroidism, unspecified 01/28/2017    Patient Active Problem List   Diagnosis Date Noted  . Elevated fasting blood sugar 01/28/2017  . Hyperlipidemia, unspecified 01/28/2017  . Hypothyroidism, unspecified 01/28/2017    Past Surgical History:  Procedure Laterality Date  . CATARACT EXTRACTION  2017  . CATARACT EXTRACTION W/PHACO Left 05/05/2017   Procedure: CATARACT EXTRACTION PHACO AND INTRAOCULAR LENS PLACEMENT ([place]) LEFT COMPLICATED;  Surgeon: Leandrew Koyanagi, MD;  Location: [place];  Service: Ophthalmology;  Laterality: Left;  . EYE SURGERY    . lazer eye   1990  . THYROIDECTOMY    . TRABECULECTOMY Left 05/05/2017   Procedure: TRABECULECTOMY WITH [facility] AND EXPRESS SHUNT;  Surgeon: Leandrew Koyanagi, MD;  Location: [place];  Service: Ophthalmology;  Laterality: Left;    OB History   No obstetric history on file.      Home Medications     Prior to Admission medications   Medication Sig Start Date End Date Taking? Authorizing Provider  aspirin EC 81 MG tablet Take by mouth.   Yes [provider]  brimonidine (ALPHAGAN P) 0.1 % SOLN Place 1 drop into both eyes 2 (two) times daily.   Yes [provider]  Cyanocobalamin (VITAMIN B 12 PO) Take by mouth.   Yes [provider]  fluticasone (FLONASE) 50 MCG/ACT nasal spray USE TWO SPRAY(S) IN EACH NOSTRIL ONCE DAILY 11/28/15  Yes [provider]  ibuprofen (ADVIL,MOTRIN) 200 MG tablet Take 800 mg by mouth at bedtime.   Yes [provider]  latanoprost (XALATAN) 0.005 % ophthalmic solution Place 1 drop into both eyes at bedtime.   Yes [provider]  levothyroxine (SYNTHROID, LEVOTHROID) 112 MCG tablet Take by mouth. 10/16/16  Yes [provider]  Multiple Vitamin (MULTI-VITAMINS) TABS Take by mouth.   Yes [provider]  potassium chloride (KCL) 2 mEq/mL SOLN oral liquid Take by mouth.   Yes [provider]  [facility] Wort 300 MG CAPS Take by mouth.   Yes [provider]  timolol (BETIMOL) 0.25 % ophthalmic solution Place 1-2 drops into both eyes 2 (two) times daily.   Yes [provider]  vitamin E 400 UNIT capsule Take by mouth.   Yes [provider]  amoxicillin-clavulanate (AUGMENTIN) 875-125 MG tablet Take 1 tablet by mouth every 12 (twelve) hours. 08/18/18   Coral Spikes, DO    Family History Family History  Problem Relation  Age of Onset  . Heart attack Father   . Pancreatic cancer Sister   . Breast cancer Neg Hx     Social History Social History   Tobacco Use  . Smoking status: Former Smoker    Last attempt to quit: 02/01/2017    Years since quitting: 1.5  . Smokeless tobacco: Never Used  Substance Use Topics  . Alcohol use: No  . Drug use: No     Allergies   Patient has no known allergies.   Review of Systems Review of Systems  Constitutional:  Positive for fever.  HENT: Positive for congestion, sinus pressure and sinus pain.    Physical Exam Triage Vital Signs ED Triage Vitals  Enc Vitals Group     BP 08/18/18 0900 (!) 148/83     Pulse Rate 08/18/18 0900 70     Resp 08/18/18 0900 16     Temp 08/18/18 0900 97.7 F (36.5 C)     Temp Source 08/18/18 0900 Oral     SpO2 08/18/18 0900 100 %     Weight 08/18/18 0900 130 lb (59 kg)     Height 08/18/18 0900 5\' 6"  (1.676 m)     Head Circumference --      Peak Flow --      Pain Score 08/18/18 0858 6     Pain Loc --      Pain Edu? --      Excl. in [place]? --    Updated Vital Signs BP (!) 148/83 (BP Location: Left Arm)   Pulse 70   Temp 97.7 F (36.5 C) (Oral)   Resp 16   Ht 5\' 6"  (1.676 m)   Wt 59 kg   SpO2 100%   BMI 20.98 kg/m   Visual Acuity Right Eye Distance:   Left Eye Distance:   Bilateral Distance:    Right Eye Near:   Left Eye Near:    Bilateral Near:     Physical Exam Vitals signs and nursing note reviewed.  Constitutional:      General: She is not in acute distress. HENT:     Head: Normocephalic and atraumatic.     Right Ear: Tympanic membrane normal.     Left Ear: Tympanic membrane normal.     Nose:     Comments: Tenderness over the maxillary region as well as the frontal regions.    Mouth/Throat:     Pharynx: Oropharynx is clear.  Eyes:     General:        Right eye: No discharge.        Left eye: No discharge.     Conjunctiva/sclera: Conjunctivae normal.  Cardiovascular:     Rate and Rhythm: Normal rate and regular rhythm.  Pulmonary:     Effort: Pulmonary effort is normal.     Breath sounds: Normal breath sounds. No wheezing, rhonchi or rales.  Neurological:     Mental Status: She is alert.  Psychiatric:        Mood and Affect: Mood normal.        Behavior: Behavior normal.     UC Treatments / Results  Labs (all labs ordered are listed, but only abnormal results are displayed) Labs Reviewed - No data to  display  EKG None  Radiology No results found.  Procedures Procedures (including critical care time)  Medications Ordered in UC Medications - No data to display  Initial Impression / Assessment and Plan / UC Course  I have reviewed the triage  vital signs and the nursing notes.  Pertinent labs & imaging results that were available during my care of the patient were reviewed by me and considered in my medical decision making (see chart for details).    73 year old female presents with sinusitis.  Treating with Augmentin.  Final Clinical Impressions(s) / UC Diagnoses   Final diagnoses:  Acute sinusitis, recurrence not specified, unspecified location   Discharge Instructions   None    ED Prescriptions    Medication Sig Dispense Auth. Provider   amoxicillin-clavulanate (AUGMENTIN) 875-125 MG tablet Take 1 tablet by mouth every 12 (twelve) hours. 14 tablet Coral Spikes, DO     Controlled Substance Prescriptions [place] Controlled Substance Registry consulted? Not Applicable   Coral Spikes, [place] 08/18/18 [phone]

## 2020-03-18 ENCOUNTER — Other Ambulatory Visit: Payer: Self-pay | Admitting: Family Medicine

## 2020-03-18 DIAGNOSIS — Z1231 Encounter for screening mammogram for malignant neoplasm of breast: Secondary | ICD-10-CM

## 2020-11-18 ENCOUNTER — Encounter: Payer: Self-pay | Admitting: Surgery

## 2020-11-18 ENCOUNTER — Ambulatory Visit (INDEPENDENT_AMBULATORY_CARE_PROVIDER_SITE_OTHER): Payer: Medicare HMO | Admitting: Surgery

## 2020-11-18 ENCOUNTER — Other Ambulatory Visit: Payer: Self-pay

## 2020-11-18 VITALS — BP 138/80 | HR 62 | Temp 98.0°F | Ht 68.0 in | Wt 138.2 lb

## 2020-11-18 DIAGNOSIS — R221 Localized swelling, mass and lump, neck: Secondary | ICD-10-CM

## 2020-11-18 NOTE — Patient Instructions (Signed)
Please see your appointment listed below. Stop taking the Aspirin 5 days prior to the procedure.

## 2020-11-21 NOTE — Progress Notes (Signed)
Patient ID: Erica Ingram, female   DOB: June 22, 1946, 75 y.o.   MRN: [id number]  HPI Erica Ingram is a 75 y.o. female seen for a subcutaneous mass on the right neck and left shoulder.  She reports that they have been slowly growing in size.  I did see her more than 4 years ago.  At that time she opted not to do any excision.  She reports that the one neck seems to be growing a little bit more the one on the left 1 seems to be also growing.  No fevers no chills no evidence of B type symptoms.  She reports some mild discomfort but not really pain.  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  Her CMP is completely normal as well as her hemoglobin A1c Is accompanied by her sister.  She is able to perform more than 4 METS of activity without any shortness of or chest pain. He did have a history of thyroidectomy in the remote past   HPI  Past Medical History:  Diagnosis Date  . Arthritis    hands  . Bell's palsy   . Elevated fasting blood sugar 01/28/2017  . Glaucoma   . Hyperlipidemia, unspecified 01/28/2017  . Hypothyroidism   . Hypothyroidism, unspecified 01/28/2017    Past Surgical History:  Procedure Laterality Date  . CATARACT EXTRACTION  2017  . CATARACT EXTRACTION W/PHACO Left 05/05/2017   Procedure: CATARACT EXTRACTION PHACO AND INTRAOCULAR LENS PLACEMENT ([place]) LEFT COMPLICATED;  Surgeon: Leandrew Koyanagi, MD;  Location: [place];  Service: Ophthalmology;  Laterality: Left;  . EYE SURGERY    . lazer eye   1990  . THYROIDECTOMY    . TRABECULECTOMY Left 05/05/2017   Procedure: TRABECULECTOMY WITH [facility] AND EXPRESS SHUNT;  Surgeon: Leandrew Koyanagi, MD;  Location: [place];  Service: Ophthalmology;  Laterality: Left;    Family History  Problem Relation Age of Onset  . Heart attack Father   . Pancreatic cancer Sister   . Breast cancer Neg Hx      Social History Social History   Tobacco Use  . Smoking status: Former Smoker    Quit date: 02/01/2017    Years since quitting: 3.8  . Smokeless tobacco: Never Used  Substance Use Topics  . Alcohol use: No  . Drug use: No    No Known Allergies  Current Outpatient Medications  Medication Sig Dispense Refill  . aspirin EC 81 MG tablet Take by mouth.    . brimonidine (ALPHAGAN P) 0.1 % SOLN Place 1 drop into both eyes 2 (two) times daily.    . Cyanocobalamin (VITAMIN B 12 PO) Take by mouth.    . fluticasone (FLONASE) 50 MCG/ACT nasal spray USE TWO SPRAY(S) IN EACH NOSTRIL ONCE DAILY    . ibuprofen (ADVIL,MOTRIN) 200 MG tablet Take 800 mg by mouth at bedtime.    Marland Kitchen latanoprost (XALATAN) 0.005 % ophthalmic solution Place 1 drop into both eyes at bedtime.    Marland Kitchen levothyroxine (SYNTHROID, LEVOTHROID) 112 MCG tablet Take by mouth.    . Multiple Vitamin (MULTI-VITAMINS) TABS Take by mouth.    . potassium chloride (KCL) 2 mEq/mL SOLN oral liquid Take by mouth.    Francella Solian Johns Wort 300 MG CAPS Take by mouth.    . timolol (BETIMOL) 0.25 % ophthalmic solution Place 1-2 drops into both eyes  2 (two) times daily.    . vitamin E 400 UNIT capsule Take by mouth.     No current facility-administered medications for this visit.     Review of Systems Full ROS  was asked and was negative except for the information on the HPI  Physical Exam Blood pressure 138/80, pulse 62, temperature 98 F (36.7 C), temperature source Oral, height 5\' 8"  (1.727 m), weight 138 lb 3.2 oz (62.7 kg), SpO2 96 %. CONSTITUTIONAL: NAD. EYES: Pupils are equal, round, Sclera are non-icteric. EARS, NOSE, MOUTH AND THROAT: She is wearing a mask Hearing is intact to voice. LYMPH NODES:  Lymph nodes in the neck are normal. Neck: There is a soft mobile mass 3.5 cm right shoulder lateral to SCM and above clavicle. thyroid is nml, no JVD RESPIRATORY:  Lungs are clear. There is normal respiratory effort, with equal breath sounds  bilaterally, and without pathologic use of accessory muscles. CARDIOVASCULAR: Heart is regular without murmurs, gallops, or rubs. GI: The abdomen is  soft, nontender, and nondistended. There are no palpable masses. There is no hepatosplenomegaly. There are normal bowel sounds in all quadrants. GU: Rectal deferred.   MUSCULOSKELETAL: Normal muscle strength and tone. No cyanosis or edema.  There is a 4 cms subq tissue mass left shoulder, mobile SKIN: Turgor is good and there are no pathologic skin lesions or ulcers. NEUROLOGIC: Motor and sensation is grossly normal. Cranial nerves are grossly intact. PSYCH:  Oriented to person, place and time. Affect is normal.  Data Reviewed  I have personally reviewed the patient's imaging, laboratory findings and medical records.    Assessment/Plan 75 year old female with symptomatic progressively growing subcutaneous tissue masses one in the right neck and the other 1 on the left shoulder.  Discussed with the patient in detail and she is ready to have this removed.  We have actually seen progressions for about 4 years.  I do think that is reasonable to perform this here in the office under local.  Procedure discussed with the patient in detail.  Risks, benefits and possible complications.  We will schedule this at her convenience in a week or 2.  Caroleen Hamman, MD FACS General Surgeon 11/21/2020, 7:41 AM

## 2020-12-30 ENCOUNTER — Other Ambulatory Visit: Payer: Self-pay | Admitting: Surgery

## 2020-12-30 ENCOUNTER — Other Ambulatory Visit: Payer: Self-pay

## 2020-12-30 ENCOUNTER — Ambulatory Visit: Payer: Medicare HMO | Admitting: Surgery

## 2020-12-30 ENCOUNTER — Encounter: Payer: Self-pay | Admitting: Surgery

## 2020-12-30 VITALS — BP 161/71 | HR 64 | Temp 98.0°F | Ht 67.0 in | Wt 136.0 lb

## 2020-12-30 DIAGNOSIS — D170 Benign lipomatous neoplasm of skin and subcutaneous tissue of head, face and neck: Secondary | ICD-10-CM | POA: Diagnosis not present

## 2020-12-30 DIAGNOSIS — D1722 Benign lipomatous neoplasm of skin and subcutaneous tissue of left arm: Secondary | ICD-10-CM | POA: Diagnosis not present

## 2020-12-30 NOTE — Progress Notes (Signed)
Procedure Note 1. Excision of subfascial Right supraclavicular 6.5cm Neck mass 2. 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  We started with the right neck mass local anesthetic was used to infiltrate the subcutaneous tissue as well as the perimuscular tissue.  Incision was created with a 15 blade knife and we used Metzenbaum scissors to dissect through subcutaneous tissue.  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  We preserve the  major sympathetic nerve branches and spinal accessory nerve.  The soft tissue mass was found to be soft fascial specifically laying underneath the trapezius muscle.  We dissected mass from the adjacent tissue using Metzenbaum scissors.  Mass was excised and sent to for permanent pathology.  Wound was closed in a 2 layer fashion with 3-0 Vicryl for the deep tissue and 4-0 Monocryl for the skin.  Dermabond was applied Attention then was turned to the left shoulder where lidocaine 1% with epinephrine was used to infiltrate the skin and soft tissue.  Incision was created with a 15 blade knife and Metzenbaum scissors were used to dissect through subcutaneous tissue.  Please note that we had to incise the fascia since the lipoma was found to be deep.  The lipoma was delivered in the standard fashion and sent for permanent pathology.  The fascia and subcutaneous tissues were closed with interrupted 3-0 Vicryl's and the skin was closed with 4-0 Monocryl in a subcuticular fashion.  Dermabond was applied.  No complications

## 2020-12-30 NOTE — Patient Instructions (Signed)
Today we have removed a Lipoma in our office. Please see information below regarding this type of tumor.  You are free to shower in 48 hours. This will be on 01/01/21. .  You have glue on your skin and sutures under the skin. The glue will come off on it's own in 10-14 days. You may shower normally until this occurs but do not submerge.  Please use Tylenol or Ibuprofen for pain as needed.  We will see you back in 2 weeks to ensure that this has healed and to review the final pathology. Please see your appointment below. You may continue your regular activities right away but if you are having pain while doing something, stop what you are doing and try this activity once again in 3 days. Please call our office with any questions or concerns prior to your appointment.   Lipoma Removal Lipoma removal is a surgical procedure to remove a noncancerous (benign) tumor that is made up of fat cells (lipoma). Most lipomas are small and painless and do not require treatment. They can form in many areas of the body but are most common under the skin of the back, shoulders, arms, and thighs. You may need lipoma removal if you have a lipoma that is large, growing, or causing discomfort. Lipoma removal may also be done for cosmetic reasons. Tell a health care provider about:  Any allergies you have.  All medicines you are taking, including vitamins, herbs, eye drops, creams, and over-the-counter medicines.  Any problems you or family members have had with anesthetic medicines.  Any blood disorders you have.  Any surgeries you have had.  Any medical conditions you have.  Whether you are pregnant or may be pregnant. What are the risks? Generally, this is a safe procedure. However, problems may occur, including:  Infection.  Bleeding.  Allergic reactions to medicines.  Damage to nerves or blood vessels near the lipoma.  Scarring.  What happens before the procedure? Staying hydrated Follow  instructions from your health care provider about hydration, which may include:  Up to 2 hours before the procedure - you may continue to drink clear liquids, such as water, clear fruit juice, black coffee, and plain tea.  Eating and drinking restrictions Follow instructions from your health care provider about eating and drinking, which may include:  8 hours before the procedure - stop eating heavy meals or foods such as meat, fried foods, or fatty foods.  6 hours before the procedure - stop eating light meals or foods, such as toast or cereal.  6 hours before the procedure - stop drinking milk or drinks that contain milk.  2 hours before the procedure - stop drinking clear liquids.  Medicines  Ask your health care provider about: ? Changing or stopping your regular medicines. This is especially important if you are taking diabetes medicines or blood thinners. ? Taking medicines such as aspirin and ibuprofen. These medicines can thin your blood. Do not take these medicines before your procedure if your health care provider instructs you not to.  You may be given antibiotic medicine to help prevent infection. General instructions  Ask your health care provider how your surgical site will be marked or identified.  You will have a physical exam. Your health care provider will check the size of the lipoma and whether it can be moved easily.  You may have imaging tests, such as: ? X-rays. ? CT scan. ? MRI.  Plan to have someone take you home  from the hospital or clinic. What happens during the procedure?  To reduce your risk of infection: ? Your health care team will wash or sanitize their hands. ? Your skin will be washed with soap.  You will be given one or more of the following: ? A medicine to help you relax (sedative). ? A medicine to numb the area (local anesthetic). ? A medicine to make you fall asleep (general anesthetic). ? A medicine that is injected into an area of  your body to numb everything below the injection site (regional anesthetic).  An incision will be made over the lipoma or very near the lipoma. The incision may be made in a natural skin line or crease.  Tissues, nerves, and blood vessels near the lipoma will be moved out of the way.  The lipoma and the capsule that surrounds it will be separated from the surrounding tissues.  The lipoma will be removed.  The incision may be closed with stitches (sutures).  A bandage (dressing) will be placed over the incision. What happens after the procedure?  Do not drive for 24 hours if you received a sedative.  Your blood pressure, heart rate, breathing rate, and blood oxygen level will be monitored until the medicines you were given have worn off. This information is not intended to replace advice given to you by your health care provider. Make sure you discuss any questions you have with your health care provider. Document Released: 10/17/2015 Document Revised: 01/09/2016 Document Reviewed: 10/17/2015 Elsevier Interactive Patient Education  [employer].

## 2021-01-06 ENCOUNTER — Ambulatory Visit (INDEPENDENT_AMBULATORY_CARE_PROVIDER_SITE_OTHER): Payer: Medicare HMO | Admitting: Surgery

## 2021-01-06 ENCOUNTER — Encounter: Payer: Self-pay | Admitting: Surgery

## 2021-01-06 ENCOUNTER — Other Ambulatory Visit: Payer: Self-pay

## 2021-01-06 VITALS — BP 138/77 | HR 62 | Temp 98.3°F | Ht 68.0 in | Wt 138.0 lb

## 2021-01-06 DIAGNOSIS — Z09 Encounter for follow-up examination after completed treatment for conditions other than malignant neoplasm: Secondary | ICD-10-CM

## 2021-01-06 NOTE — Patient Instructions (Signed)
The areas are healing well. The glue should start to come off in 1-2 weeks.   Do not submerge the areas until fully healed.   Once the areas are healed you may rub Vitamin-E oil or other emmolient agent in area 2-3 times a day to soften. You will need to use sunscreen for the next year on the area to minimize altered pigmentation of the site.  Follow-up with our office as needed.  Please call and ask to speak with a nurse if you develop questions or concerns.

## 2021-01-07 NOTE — Progress Notes (Signed)
Rhiannon is a 75 year old female status post soft tissue mass excisions on the right neck and left shoulder.  She is doing very well.  Pathology consistent with lipoma.  Results discussed with the patient in detail.  She is completely asymptomatic.  No fevers no chills.  Physical exam: No acute distress Patient is healing well without evidence of infection  A/P doing very well w/o complications  rtc prn

## 2021-01-17 LAB — COLOGUARD: COLOGUARD: NEGATIVE

## 2021-01-17 LAB — EXTERNAL GENERIC LAB PROCEDURE: COLOGUARD: NEGATIVE

## 2022-03-05 ENCOUNTER — Encounter: Payer: Self-pay | Admitting: Emergency Medicine

## 2022-03-05 ENCOUNTER — Other Ambulatory Visit: Payer: Self-pay

## 2022-03-05 ENCOUNTER — Observation Stay
Admission: EM | Admit: 2022-03-05 | Discharge: 2022-03-06 | Disposition: A | Discharge status: Home Health | Payer: Medicare HMO | Attending: Internal Medicine | Admitting: Internal Medicine

## 2022-03-05 ENCOUNTER — Observation Stay: Payer: Medicare HMO

## 2022-03-05 ENCOUNTER — Emergency Department: Payer: Medicare HMO

## 2022-03-05 DIAGNOSIS — Z79899 Other long term (current) drug therapy: Secondary | ICD-10-CM | POA: Diagnosis not present

## 2022-03-05 DIAGNOSIS — H53461 Homonymous bilateral field defects, right side: Secondary | ICD-10-CM

## 2022-03-05 DIAGNOSIS — E039 Hypothyroidism, unspecified: Secondary | ICD-10-CM | POA: Diagnosis present

## 2022-03-05 DIAGNOSIS — Z7982 Long term (current) use of aspirin: Secondary | ICD-10-CM | POA: Insufficient documentation

## 2022-03-05 DIAGNOSIS — D696 Thrombocytopenia, unspecified: Secondary | ICD-10-CM | POA: Diagnosis not present

## 2022-03-05 DIAGNOSIS — I6320 Cerebral infarction due to unspecified occlusion or stenosis of unspecified precerebral arteries: Principal | ICD-10-CM | POA: Insufficient documentation

## 2022-03-05 DIAGNOSIS — I639 Cerebral infarction, unspecified: Secondary | ICD-10-CM | POA: Diagnosis present

## 2022-03-05 DIAGNOSIS — Z87891 Personal history of nicotine dependence: Secondary | ICD-10-CM | POA: Insufficient documentation

## 2022-03-05 DIAGNOSIS — E785 Hyperlipidemia, unspecified: Secondary | ICD-10-CM | POA: Diagnosis not present

## 2022-03-05 DIAGNOSIS — R42 Dizziness and giddiness: Secondary | ICD-10-CM | POA: Diagnosis present

## 2022-03-05 LAB — DIFFERENTIAL
Abs Immature Granulocytes: 0.02 10*3/uL (ref 0.00–0.07)
Basophils Absolute: 0 10*3/uL (ref 0.0–0.1)
Basophils Relative: 0 %
Eosinophils Absolute: 0.1 10*3/uL (ref 0.0–0.5)
Eosinophils Relative: 1 %
Immature Granulocytes: 0 %
Lymphocytes Relative: 16 %
Lymphs Abs: 1.1 10*3/uL (ref 0.7–4.0)
Monocytes Absolute: 0.4 10*3/uL (ref 0.1–1.0)
Monocytes Relative: 5 %
Neutro Abs: 5.6 10*3/uL (ref 1.7–7.7)
Neutrophils Relative %: 78 %

## 2022-03-05 LAB — VITAMIN B12: Vitamin B-12: 1791 pg/mL — ABNORMAL HIGH (ref 180–914)

## 2022-03-05 LAB — PROTIME-INR
INR: 1.1 (ref 0.8–1.2)
Prothrombin Time: 13.7 seconds (ref 11.4–15.2)

## 2022-03-05 LAB — COMPREHENSIVE METABOLIC PANEL
ALT: 14 U/L (ref 0–44)
AST: 21 U/L (ref 15–41)
Albumin: 4.2 g/dL (ref 3.5–5.0)
Alkaline Phosphatase: 67 U/L (ref 38–126)
Anion gap: 7 (ref 5–15)
BUN: 18 mg/dL (ref 8–23)
CO2: 27 mmol/L (ref 22–32)
Calcium: 9.5 mg/dL (ref 8.9–10.3)
Chloride: 106 mmol/L (ref 98–111)
Creatinine, Ser: 0.74 mg/dL (ref 0.44–1.00)
GFR, Estimated: >60 mL/min (ref >60)
Glucose, Bld: 97 mg/dL (ref 70–99)
Potassium: 4 mmol/L (ref 3.5–5.1)
Sodium: 140 mmol/L (ref 135–145)
Total Bilirubin: 1 mg/dL (ref 0.3–1.2)
Total Protein: 7 g/dL (ref 6.5–8.1)

## 2022-03-05 LAB — CBC
HCT: 38.4 % (ref 36.0–46.0)
Hemoglobin: 12.1 g/dL (ref 12.0–15.0)
MCH: 30.3 pg (ref 26.0–34.0)
MCHC: 31.5 g/dL (ref 30.0–36.0)
MCV: 96 fL (ref 80.0–100.0)
Platelets: 135 10*3/uL — ABNORMAL LOW (ref 150–400)
RBC: 4 MIL/uL (ref 3.87–5.11)
RDW: 12.3 % (ref 11.5–15.5)
WBC: 7.2 10*3/uL (ref 4.0–10.5)
nRBC: 0 % (ref 0.0–0.2)

## 2022-03-05 LAB — ETHANOL: Alcohol, Ethyl (B): <10 mg/dL (ref <10)

## 2022-03-05 LAB — APTT: aPTT: 27 seconds (ref 24–36)

## 2022-03-05 MED ORDER — FLUTICASONE PROPIONATE 50 MCG/ACT NA SUSP
2.0000 | Freq: Every day | NASAL | Status: DC | PRN
Start: 2022-03-05 — End: 2022-03-06

## 2022-03-05 MED ORDER — SENNOSIDES-DOCUSATE SODIUM 8.6-50 MG PO TABS: 1.0000 | ORAL_TABLET | Freq: Every evening | ORAL | Status: DC | PRN

## 2022-03-05 MED ORDER — ACETAMINOPHEN 160 MG/5ML PO SOLN: 650.0000 mg | ORAL | Status: DC | PRN

## 2022-03-05 MED ORDER — ACETAMINOPHEN 650 MG RE SUPP: 650.0000 mg | RECTAL | Status: DC | PRN

## 2022-03-05 MED ORDER — POLYETHYLENE GLYCOL 3350 17 G PO PACK
17.0000 g | PACK | Freq: Every day | ORAL | Status: DC | PRN
Start: 2022-03-05 — End: 2022-03-06

## 2022-03-05 MED ORDER — ACETAMINOPHEN 325 MG PO TABS
650.0000 mg | ORAL_TABLET | ORAL | Status: DC | PRN
  Administered 2022-03-05 – 2022-03-06 (×2): 650 mg via ORAL
  Filled 2022-03-05 (×2): qty 2

## 2022-03-05 MED ORDER — CLOPIDOGREL BISULFATE 75 MG PO TABS
75.0000 mg | ORAL_TABLET | Freq: Every day | ORAL | Status: DC
  Administered 2022-03-06: 75 mg via ORAL
  Filled 2022-03-05: qty 1

## 2022-03-05 MED ORDER — LATANOPROST 0.005 % OP SOLN
1.0000 [drp] | Freq: Every day | OPHTHALMIC | Status: DC
  Filled 2022-03-05: qty 2.5

## 2022-03-05 MED ORDER — HYDRALAZINE HCL 20 MG/ML IJ SOLN: 5.0000 mg | Freq: Four times a day (QID) | INTRAMUSCULAR | Status: DC | PRN

## 2022-03-05 MED ORDER — IOHEXOL 350 MG/ML SOLN
75.0000 mL | Freq: Once | INTRAVENOUS | Status: AC | PRN
  Administered 2022-03-05: 75 mL via INTRAVENOUS

## 2022-03-05 MED ORDER — BRIMONIDINE TARTRATE 0.2 % OP SOLN
1.0000 [drp] | Freq: Two times a day (BID) | OPHTHALMIC | Status: DC
Start: 2022-03-05 — End: 2022-03-06
  Administered 2022-03-06: 1 [drp] via OPHTHALMIC
  Filled 2022-03-05: qty 5

## 2022-03-05 MED ORDER — ASPIRIN 325 MG PO TABS
325.0000 mg | ORAL_TABLET | Freq: Every day | ORAL | Status: DC
  Administered 2022-03-05: 325 mg via ORAL
  Filled 2022-03-05: qty 1

## 2022-03-05 MED ORDER — LEVOTHYROXINE SODIUM 112 MCG PO TABS
112.0000 ug | ORAL_TABLET | Freq: Every day | ORAL | Status: DC
  Administered 2022-03-06: 112 ug via ORAL
  Filled 2022-03-05: qty 1

## 2022-03-05 MED ORDER — DORZOLAMIDE HCL-TIMOLOL MAL 2-0.5 % OP SOLN
1.0000 [drp] | OPHTHALMIC | Status: DC
  Administered 2022-03-06: 1 [drp] via OPHTHALMIC
  Filled 2022-03-05: qty 10

## 2022-03-05 MED ORDER — ASPIRIN 81 MG PO CHEW
81.0000 mg | CHEWABLE_TABLET | Freq: Every day | ORAL | Status: DC
  Administered 2022-03-06: 81 mg via ORAL
  Filled 2022-03-05: qty 1

## 2022-03-05 MED ORDER — STROKE: EARLY STAGES OF RECOVERY BOOK: Freq: Once | Status: DC

## 2022-03-05 NOTE — ED Notes (Signed)
Informed RN bed assigned 

## 2022-03-05 NOTE — Progress Notes (Signed)
   03/05/22 1350  Clinical Encounter Type  Visited With Family  Visit Type Initial;Social support   Chaplain Anmol Paschen engaged pt's daughter while present on unit. No code stroke was announced for this pt but that is what daughter shared had happened; stated that her mother has been caregiver to her spouse, if understood correctly. Chaplain B offered compassionate presence and normalization of emotions that may come up as situation is processed.  Let family know that I would remain available for emotional support as needed.

## 2022-03-05 NOTE — Progress Notes (Signed)
Patient admitted to room 116 from ED. VSS with permissive hypertension per Neurology recommendations. Patient s/p stroke. No complaint of pain at this time. PRN Tylenol available if Headache presents. Patient able to ambulate with standby assist. Assessment completed. Bed locked and in lowest position. Call bell and needs within reach. Family at bedside. Stroke:Early Stages of Recovery Book provided to patient and family for review. Will continue to monitor and assess with POC.

## 2022-03-05 NOTE — ED Triage Notes (Signed)
Pt comes into the ED via wheelchair from [facility] with c/o left sided HA with dizziness, off balance when she woke up this morning, no noted facial droop.

## 2022-03-05 NOTE — Hospital Course (Addendum)
76 year old female with history of hypothyroid, who presents emergency department for chief concerns of left-sided headache, dizziness, and imbalance.Initial vitals in the emergency department showed temperature of 99, respiration rate of 16, heart rate of 61, blood pressure 148/79, SPO2 100% on room air. Serum sodium is 140, potassium 4.0, chloride 106, bicarb 27, BUN of 18, serum creatinine of 0.74, GFR greater than 60, nonfasting blood glucose 97, WBC 7.2, hemoglobin 12.1, platelets of 135. EtOH was negative in the emergency department. CT of the head without contrast: Read as findings compatible with acute or subacute left PCA territory infarct.  Possible subtle associated petechial hemorrhage without mass occupying acute hemorrhage.  No significant mass effect.  Hyperdense appearance of the left P2 PCA, suspicious for thrombus given the above findings. ED treatment: Aspirin 325 mg p.o. 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  Seen by neurology-underwent further extensive work-up LDL 112 goal less than 70. 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 Patient reports she has baseline asymptomatic bradycardia.  Echo came back without any significant finding negative bubble study.  Discussed with Dr. Quinn Axe for discharge home.

## 2022-03-05 NOTE — Assessment & Plan Note (Signed)
-   CBC in the a.m. 

## 2022-03-05 NOTE — Progress Notes (Signed)
SLP Cancellation Note  Patient Details Name: Erica Ingram MRN: [id number] DOB: 05-Aug-1946   Cancelled treatment:       Reason Eval/Treat Not Completed: Medical issues which prohibited therapy   SLP consult received and appreciated. Chart review completed. Per chart review, pt with pending STAT imaging and further medical work up. Will defer cognitive-linguistic evaluation at this time. Will continue efforts as appropriate.  Cherrie Gauze, M.S., [facility] [phone] ([place])   Quintella Baton 03/05/2022, 1:29 PM

## 2022-03-05 NOTE — ED Triage Notes (Signed)
Patient presents with granddaughter c/o headache, dizziness and feeling off balance. Patient states headache started when waking up Tuesday and symptoms have progressed since. Equal grip and sensation noted in triage.

## 2022-03-05 NOTE — Assessment & Plan Note (Addendum)
-   Left PCA infarct with P3 occlusion - Patient is outside the tPA/TNK and thrombectomy. - Neurology has been consulted and we appreciate further recommendations - Complete echo with bubble study ordered - CTA of the head and neck with and without contrast ordered by EDP at the recommendation of neurology - Fasting lipid and A1c ordered - Permissive hypertension per neurology recommendations - Frequent neuro vascular checks - N.p.o. pending swallow study - PT, OT, SLP - Fall precaution

## 2022-03-05 NOTE — Assessment & Plan Note (Signed)
Fasting lipid panel ordered

## 2022-03-05 NOTE — H&P (Signed)
History and Physical   [name]:[id number] DOB: 1946/07/04 DOA: 03/05/2022  PCP: Erica Hartigan, Ingram  Patient coming from: Erica Ingram clinic via wheelchair  I have personally briefly reviewed patient's old medical records in [place].  Chief Concern: Dizziness and headaches  HPI: [name] is a 76 year old female with history of hypothyroid, who presents emergency department for chief concerns of left-sided headache, dizziness, and imbalance.  Initial vitals in the emergency department showed temperature of 99, respiration rate of 16, heart rate of 61, blood pressure 148/79, SPO2 100% on room air.  Serum sodium is 140, potassium 4.0, chloride 106, bicarb 27, BUN of 18, serum creatinine of 0.74, GFR greater than 60, nonfasting blood glucose 97, WBC 7.2, hemoglobin 12.1, platelets of 135.  EtOH was negative in the emergency department.  CT of the head without contrast: Read as findings compatible with acute or subacute left PCA territory infarct.  Possible subtle associated petechial hemorrhage without mass occupying acute hemorrhage.  No significant mass effect.  Hyperdense appearance of the left P2 PCA, suspicious for thrombus given the above findings.  ED treatment: Aspirin 325 mg p.o. one-time dose  At bedside patient is able to tell me her full name, her age, her current location, and the current calendar year.  She is able to identify her daughter and granddaughter at bedside.    She reports that she has been having dizziness, imbalance, headaches since Monday 03/02/2022.  She reports she is never had symptoms like this before.  She endorses nausea and denies vomiting, chest pain, shortness of breath, abdominal pain, dysuria, hematuria, diarrhea, syncope, loss of consciousness.  She reports she takes an aspirin daily.  Social history: She lives with her husband. She is a former tobacco user, quitting between 10-15 years ago. She infrequently drinks etoh,  last drink was many years ago. She denies drug use. She is retired and formerly did Medical sales representative work.   ROS: Constitutional: no weight change, no fever ENT/Mouth: no sore throat, no rhinorrhea Eyes: no eye pain, no vision changes Cardiovascular: no chest pain, no dyspnea,  no edema, no palpitations Respiratory: no cough, no sputum, no wheezing Gastrointestinal: + nausea, no vomiting, no diarrhea, no constipation Genitourinary: no urinary incontinence, no dysuria, no hematuria Musculoskeletal: no arthralgias, no myalgias Skin: no skin lesions, no pruritus, Neuro: + weakness, no loss of consciousness, no syncope, + imbalance Psych: no anxiety, no depression, + decrease appetite Heme/Lymph: no bruising, no bleeding  ED Course: Discussed with emergency medicine provider, patient requiring hospitalization for chief concerns of stroke.  Assessment/Plan  Principal Problem:   Stroke [facility]) Active Problems:   Hyperlipidemia   Hypothyroidism   Thrombopenia (HCC)   Assessment and Plan:  * Stroke ([place]) - Left PCA infarct with P3 occlusion - Patient is outside the tPA/TNK and thrombectomy. - Neurology has been consulted and we appreciate further recommendations - Complete echo with bubble study ordered - CTA of the head and neck with and without contrast ordered by EDP at the recommendation of neurology - Fasting lipid and A1c ordered - Permissive hypertension per neurology recommendations - Frequent neuro vascular checks - N.p.o. pending swallow study - PT, OT, SLP - Fall precaution  Thrombopenia ([place]) - CBC in the a.m.  Hypothyroidism - Resumed home levothyroxine 112 mcg daily in the morning  Hyperlipidemia - Fasting lipid panel ordered  DVT prophylaxis-pharmacologic DVT prophylaxis has been held on admission due to petechial hemorrhage on CT of the head without contrast. - AM  team to initiate pharmacologic DVT prophylaxis when the benefits outweigh the risk  Chart reviewed.    DVT prophylaxis: TED hose Code Status: full code  Diet: heart healthy Family Communication: Erica Ingram, she is daughter and healthcare POA Disposition Plan: Pending clinical course Consults called: neuropathy Admission status: Telemetry medical, observation  Past Medical History:  Diagnosis Date   Arthritis    hands   Bell's palsy    Elevated fasting blood sugar 01/28/2017   Glaucoma    Hyperlipidemia, unspecified 01/28/2017   Hypothyroidism    Hypothyroidism, unspecified 01/28/2017   Past Surgical History:  Procedure Laterality Date   CATARACT EXTRACTION  2017   CATARACT EXTRACTION W/PHACO Left 05/05/2017   Procedure: CATARACT EXTRACTION PHACO AND INTRAOCULAR LENS PLACEMENT ([place]) LEFT COMPLICATED;  Surgeon: Leandrew Koyanagi, Ingram;  Location: [facility];  Service: Ophthalmology;  Laterality: Left;   EYE SURGERY     lazer eye   1990   THYROIDECTOMY     TRABECULECTOMY Left 05/05/2017   Procedure: TRABECULECTOMY WITH [facility] AND EXPRESS SHUNT;  Surgeon: Leandrew Koyanagi, Ingram;  Location: [place];  Service: Ophthalmology;  Laterality: Left;   Social History:  reports that she quit smoking about 5 years ago. She has never used smokeless tobacco. She reports that she does not drink alcohol and does not use drugs.  No Known Allergies Family History  Problem Relation Age of Onset   Heart attack Father    Pancreatic cancer Sister    Breast cancer Neg Hx    Family history: Family history reviewed and not pertinent.  Prior to Admission medications   Medication Sig Start Date End Date Taking? Authorizing Provider  aspirin EC 81 MG tablet Take by mouth.    Provider, Historical, Ingram  brimonidine (ALPHAGAN P) 0.1 % SOLN Place 1 drop into both eyes 2 (two) times daily.    Provider, Historical, Ingram  Cyanocobalamin (VITAMIN B 12 PO) Take by mouth.    Provider, Historical, Ingram  fluticasone (FLONASE) 50 MCG/ACT nasal spray USE TWO SPRAY(S) IN EACH NOSTRIL ONCE DAILY  11/28/15   Provider, Historical, Ingram  ibuprofen (ADVIL,MOTRIN) 200 MG tablet Take 800 mg by mouth at bedtime.    Provider, Historical, Ingram  latanoprost (XALATAN) 0.005 % ophthalmic solution Place 1 drop into both eyes at bedtime.    Provider, Historical, Ingram  levothyroxine (SYNTHROID, LEVOTHROID) 112 MCG tablet Take by mouth. 10/16/16   Provider, Historical, Ingram  Multiple Vitamin (MULTI-VITAMINS) TABS Take by mouth.    Provider, Historical, Ingram  potassium chloride (KCL) 2 mEq/mL SOLN oral liquid Take by mouth.    Provider, Historical, Ingram  [facility] Wort 300 MG CAPS Take by mouth.    Provider, Historical, Ingram  timolol (BETIMOL) 0.25 % ophthalmic solution Place 1-2 drops into both eyes 2 (two) times daily.    Provider, Historical, Ingram  vitamin E 400 UNIT capsule Take by mouth.    Provider, Historical, Ingram   Physical Exam: Vitals:   03/05/22 1235 03/05/22 1300 03/05/22 1330 03/05/22 1343  BP: (!) 151/81 (!) 154/81 139/89   Pulse: 62 (!) 57 64   Resp: '17 17 15   '$ Temp:    98.3 F (36.8 C)  TempSrc:    Oral  SpO2: 99% 94% 100%   Weight:      Height:       Constitutional: appears age-appropriate, frail, NAD, calm, comfortable Eyes: PERRL, lids and conjunctivae normal ENMT: Mucous membranes are moist. Posterior pharynx clear of any exudate or lesions.  Age-appropriate dentition. Hearing appropriate Neck: normal, supple, no masses, no thyromegaly Respiratory: clear to auscultation bilaterally, no wheezing, no crackles. Normal respiratory effort. No accessory muscle use.  Cardiovascular: Regular rate and rhythm, no murmurs / rubs / gallops. No extremity edema. 2+ pedal pulses. No carotid bruits.  Abdomen: no tenderness, no masses palpated, no hepatosplenomegaly. Bowel sounds positive.  Musculoskeletal: no clubbing / cyanosis. No joint deformity upper and lower extremities. Good ROM, no contractures, no atrophy. Normal muscle tone.  Skin: no rashes, lesions, ulcers. No induration Neurologic: Sensation  intact. Strength 5/5 in all 4.  Psychiatric: Normal judgment and insight. Alert and oriented x 3. Normal mood.   EKG: independently reviewed, showing sinus rhythm with rate of 61, QTc 418  Chest x-ray on Admission: I personally reviewed and I agree with radiologist reading as below.  CT ANGIO HEAD NECK W WO CM  Result Date: 03/05/2022 CLINICAL DATA:  Stroke follow-up. Left PCA infarct on earlier noncontrast head CT. EXAM: CT ANGIOGRAPHY HEAD AND NECK TECHNIQUE: Multidetector CT imaging of the head and neck was performed using the standard protocol during bolus administration of intravenous contrast. Multiplanar CT image reconstructions and MIPs were obtained to evaluate the vascular anatomy. Carotid stenosis measurements (when applicable) are obtained utilizing NASCET criteria, using the distal internal carotid diameter as the denominator. RADIATION DOSE REDUCTION: This exam was performed according to the departmental dose-optimization program which includes automated exposure control, adjustment of the mA and/or kV according to patient size and/or use of iterative reconstruction technique. CONTRAST:  53m OMNIPAQUE IOHEXOL 350 MG/ML SOLN COMPARISON:  Head CT 03/05/2022.  Neck CT 02/23/2017. FINDINGS: CTA NECK FINDINGS Aortic arch: Incomplete imaging of the aortic arch with exclusion of the brachiocephalic and left common carotid artery origins. Mild stenosis of the right subclavian artery origin due to calcified plaque. Eccentric, predominantly soft plaque in the left subclavian artery resulting in less than 50% stenosis. Right carotid system: Patent with a small amount of predominantly calcified plaque at the carotid bifurcation and in the proximal ICA. No evidence of a significant stenosis or dissection. Retropharyngeal course of the proximal to mid cervical ICA. Left carotid system: Patent with a small amount of calcified and soft plaque in the distal common carotid artery. No evidence of a significant  stenosis or dissection. Vertebral arteries: Patent and codominant without evidence of a significant stenosis on the right. Moderate to severe stenosis of the left vertebral origin. Skeleton: Cervical disc degeneration, most advanced at C5-6 and C6-7 where there is severe disc space narrowing and mild posterior longitudinal ligament ossification. No evidence of high-grade spinal stenosis. Other neck: No evidence of cervical lymphadenopathy or mass. Upper chest: Mild biapical pleuroparenchymal lung scarring. Review of the MIP images confirms the above findings CTA HEAD FINDINGS Anterior circulation: The internal carotid arteries are widely patent from skull base to carotid termini. ACAs and MCAs are patent without evidence of a proximal branch occlusion or significant proximal stenosis. No aneurysm is identified. Posterior circulation: The intracranial vertebral arteries are widely patent to the basilar. Patent PICA, AICA, and SCA origins are seen bilaterally. The basilar artery is widely patent. Both PCAs are patent proximally. There is a left P3 occlusion. No aneurysm is identified. Venous sinuses: Diminished opacification of the left sigmoid sinus favored to be due to arterial contrast timing. Anatomic variants: None. Review of the MIP images confirms the above findings IMPRESSION: 1. Left P3 occlusion. 2. 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 3. Mild cervical carotid artery atherosclerosis without  significant stenosis. Electronically Signed   By: Logan Bores M.D.   On: 03/05/2022 14:32   DG Chest Port 1 View  Result Date: 03/05/2022 CLINICAL DATA:  Stroke.  Former smoker. EXAM: PORTABLE CHEST 1 VIEW COMPARISON:  PA chest with left rib radiographs 08/07/2013 FINDINGS: Cardiac silhouette is at the upper limits of normal size. Mild calcification within the aortic arch. Mild flattening of the diaphragms and mild hyperinflation. Increased lucencies are again seen within the upper lungs  compatible with chronic emphysematous changes. The lungs are clear. No pleural effusion or pneumothorax. Mild levocurvature of the lower thoracic spine with moderate right T10-11 disc space narrowing. IMPRESSION: No acute cardiopulmonary disease process. Electronically Signed   By: Yvonne Kendall M.D.   On: 03/05/2022 12:47   CT HEAD WO CONTRAST  Result Date: 03/05/2022 CLINICAL DATA:  Dizziness, persistent/recurrent, cardiac or vascular cause suspected EXAM: CT HEAD WITHOUT CONTRAST TECHNIQUE: Contiguous axial images were obtained from the base of the skull through the vertex without intravenous contrast. RADIATION DOSE REDUCTION: This exam was performed according to the departmental dose-optimization program which includes automated exposure control, adjustment of the mA and/or kV according to patient size and/or use of iterative reconstruction technique. COMPARISON:  None Available. FINDINGS: Brain: Hypoattenuation and loss of gray differentiation involving the left PCA territory including the left occipital lobe, compatible with acute or subacute infarct.Possible subtle associated petechial hemorrhage without mass occupying acute hemorrhage. No significant mass effect. No midline shift. No hydrocephalus. No mass lesion. Vascular: Calcific intracranial atherosclerosis. Skull: No acute fracture. Sinuses/Orbits: Clear sinuses.  No acute findings. Other: No mastoid effusions. IMPRESSION: 1. Findings compatible with acute or subacute left PCA territory infarct. Possible subtle associated petechial hemorrhage without mass occupying acute hemorrhage. No significant mass effect. Recommend MRI to confirm and further characterize. 2. Hyperdense appearance of the left P2 PCA, suspicious for thrombus given the above findings. Recommend CTA or MRA to evaluate. Findings discussed with Dr. Quentin Cornwall via telephone at 10 a.m. Electronically Signed   By: Margaretha Sheffield M.D.   On: 03/05/2022 10:01    Labs on Admission: I  have personally reviewed following labs  CBC: Recent Labs  Lab 03/05/22 0916  WBC 7.2  NEUTROABS 5.6  HGB 12.1  HCT 38.4  MCV 96.0  PLT 423*   Basic Metabolic Panel: Recent Labs  Lab 03/05/22 0916  NA 140  K 4.0  CL 106  CO2 27  GLUCOSE 97  BUN 18  CREATININE 0.74  CALCIUM 9.5   GFR: Estimated Creatinine Clearance: 51.9 mL/min (by C-G formula based on SCr of 0.74 mg/dL).  Liver Function Tests: Recent Labs  Lab 03/05/22 0916  AST 21  ALT 14  ALKPHOS 67  BILITOT 1.0  PROT 7.0  ALBUMIN 4.2   Coagulation Profile: Recent Labs  Lab 03/05/22 0916  INR 1.1   Dr. Tobie Poet [facility]  If 7PM-7AM, please contact overnight-coverage provider If 7AM-7PM, please contact day coverage provider [email or web address]  03/05/2022, 3:13 PM

## 2022-03-05 NOTE — Assessment & Plan Note (Signed)
-   Resumed home levothyroxine 112 mcg daily in the morning

## 2022-03-05 NOTE — ED Provider Notes (Signed)
[facility] Provider Note    Event Date/Time   First MD Initiated Contact with Patient 03/05/22 1150     (approximate)   History   Dizziness and Headache   HPI  Erica Ingram is a 76 y.o. female who presents to the ER for evaluation of dizziness some headache blurred vision and unsteadiness with ambulation that started in the right before bedtime on Monday which she woke up with the symptoms on Tuesday.  Denies any history of stroke.  Does have remote history of smoking.     Physical Exam   Triage Vital Signs: ED Triage Vitals  Enc Vitals Group     BP 03/05/22 0913 (!) 148/79     Pulse Rate 03/05/22 0913 61     Resp 03/05/22 0913 16     Temp 03/05/22 0913 98 F (36.7 C)     Temp Source 03/05/22 0913 Oral     SpO2 03/05/22 0913 100 %     Weight 03/05/22 0914 123 lb (55.8 kg)     Height 03/05/22 0914 '5\' 8"'$  (1.727 m)     Head Circumference --      Peak Flow --      Pain Score 03/05/22 0912 8     Pain Loc --      Pain Edu? --      Excl. in [place]? --     Most recent vital signs: Vitals:   03/05/22 0913  BP: (!) 148/79  Pulse: 61  Resp: 16  Temp: 98 F (36.7 C)  SpO2: 100%     Constitutional: Alert  Eyes: Conjunctivae are normal.  Head: Atraumatic. Nose: No congestion/rhinnorhea. Mouth/Throat: Mucous membranes are moist.   Neck: Painless ROM.  Cardiovascular:   Good peripheral circulation. Respiratory: Normal respiratory effort.  No retractions.  Gastrointestinal: Soft and nontender.  Musculoskeletal:  no deformity Neurologic:  MAE spontaneously.  Right visual field is blurry.  No facial droop normal finger-nose-finger Skin:  Skin is warm, dry and intact. No rash noted. Psychiatric: Mood and affect are normal. Speech and behavior are normal.    ED Results / Procedures / Treatments   Labs (all labs ordered are listed, but only abnormal results are displayed) Labs Reviewed  CBC - Abnormal; Notable for the following  components:      Result Value   Platelets 135 (*)    All other components within normal limits  PROTIME-INR  APTT  DIFFERENTIAL  COMPREHENSIVE METABOLIC PANEL  ETHANOL  CBG MONITORING, ED     EKG  ED ECG REPORT I, Merlyn Lot, the attending physician, personally viewed and interpreted this ECG.   Date: 03/05/2022  EKG Time: 9:14  Rate: 60  Rhythm: sinus  Axis: normal  Intervals: normal  ST&T Change: no stemi, no depressions    RADIOLOGY Please see ED Course for my review and interpretation.  I personally reviewed all radiographic images ordered to evaluate for the above acute complaints and reviewed radiology reports and findings.  These findings were personally discussed with the patient.  Please see medical record for radiology report.    PROCEDURES:  Critical Care performed: No  Procedures   MEDICATIONS ORDERED IN ED: Medications  aspirin tablet 325 mg (has no administration in time range)     IMPRESSION / MDM / ASSESSMENT AND PLAN / ED COURSE  I reviewed the triage vital signs and the nursing notes.  Differential diagnosis includes, but is not limited to, cva, tia, hypoglycemia, dehydration, electrolyte abnormality, dissection, sepsis  Patient presented to the ER for evaluation of symptoms as described above.  This presenting complaint could reflect a potentially life-threatening illness therefore the patient will be placed on continuous pulse oximetry and telemetry for monitoring.  Laboratory evaluation will be sent to evaluate for the above complaints.   Her CT imaging on my review and interpretation does not show any evidence of bleed.  Per radiology does show evidence of acute infarct with hyperdense piece to segment of the left concerning for thrombus.  She is outside of the window for TNK or PCI.  Blood work is REASSURING.  Will require hospitalization.  I consulted with neurology who agrees with admission to the  hospital and will follow along.  Will consult hospitalist for admission.  She is stable and appropriate for admission to this facility       FINAL CLINICAL IMPRESSION(S) / ED DIAGNOSES   Final diagnoses:  Cerebrovascular accident (CVA), unspecified mechanism ([place])     Rx / DC Orders   ED Discharge Orders     None        Note:  This document was prepared using Dragon voice recognition software and may include unintentional dictation errors.    Merlyn Lot, MD 03/05/22 1209

## 2022-03-06 ENCOUNTER — Observation Stay
Admit: 2022-03-06 | Discharge: 2022-03-06 | Disposition: A | Discharge status: Home / Self Care | Payer: Medicare HMO | Attending: Internal Medicine | Admitting: Internal Medicine

## 2022-03-06 DIAGNOSIS — I639 Cerebral infarction, unspecified: Secondary | ICD-10-CM | POA: Diagnosis not present

## 2022-03-06 LAB — LIPID PANEL
Cholesterol: 181 mg/dL (ref 0–200)
HDL: 56 mg/dL (ref >40)
LDL Cholesterol: 112 mg/dL — ABNORMAL HIGH (ref 0–99)
Total CHOL/HDL Ratio: 3.2 RATIO
Triglycerides: 63 mg/dL (ref <150)
VLDL: 13 mg/dL (ref 0–40)

## 2022-03-06 LAB — ECHOCARDIOGRAM COMPLETE BUBBLE STUDY
AR max vel: 2.23 cm2
AV Area VTI: 2.43 cm2
AV Area mean vel: 2.18 cm2
AV Mean grad: 2 mmHg
AV Peak grad: 4.5 mmHg
Ao pk vel: 1.06 m/s
Area-P 1/2: 3.16 cm2
S' Lateral: 1.8 cm

## 2022-03-06 LAB — HEMOGLOBIN A1C
Hgb A1c MFr Bld: 5.4 % (ref 4.8–5.6)
Mean Plasma Glucose: 108.28 mg/dL

## 2022-03-06 MED ORDER — ADULT MULTIVITAMIN W/MINERALS CH
1.0000 | ORAL_TABLET | Freq: Every day | ORAL | Status: DC
  Administered 2022-03-06: 1 via ORAL
  Filled 2022-03-06: qty 1

## 2022-03-06 MED ORDER — CLOPIDOGREL BISULFATE 75 MG PO TABS: 75.0000 mg | ORAL_TABLET | Freq: Every day | ORAL | 1 refills | Status: AC

## 2022-03-06 MED ORDER — ATORVASTATIN CALCIUM 40 MG PO TABS: 40.0000 mg | ORAL_TABLET | Freq: Every day | ORAL | 1 refills | Status: DC

## 2022-03-06 MED ORDER — ATORVASTATIN CALCIUM 20 MG PO TABS
40.0000 mg | ORAL_TABLET | Freq: Every day | ORAL | Status: DC
  Administered 2022-03-06: 40 mg via ORAL
  Filled 2022-03-06: qty 2

## 2022-03-06 MED ORDER — ENSURE ENLIVE PO LIQD
237.0000 mL | Freq: Two times a day (BID) | ORAL | Status: DC
  Administered 2022-03-06 (×2): 237 mL via ORAL

## 2022-03-06 MED ORDER — ASPIRIN EC 81 MG PO TBEC: 81.0000 mg | DELAYED_RELEASE_TABLET | Freq: Every day | ORAL | 0 refills | Status: AC

## 2022-03-06 NOTE — Progress Notes (Signed)
SLP Cancellation Note  Patient Details Name: Erica Ingram MRN: [id number] DOB: 09-23-1945   Cancelled treatment:       Reason Eval/Treat Not Completed: SLP screened, no needs identified, will sign off  Sendy Pluta B. Rutherford Nail, M.S., CCC-SLP, Mining engineer Certified Brain Injury [place]  [place] Office [phone] Ascom [phone] Fax [phone]

## 2022-03-06 NOTE — Progress Notes (Signed)
*  PRELIMINARY RESULTS* Echocardiogram 2D Echocardiogram has been performed.  Erica Ingram 03/06/2022, 8:06 AM

## 2022-03-06 NOTE — Consult Note (Signed)
NEUROLOGY CONSULTATION NOTE   Date of service: March 06, 2022 Patient Name: Erica Ingram MRN:  [id number] DOB:  1945-11-03 Reason for consult: acute infarct Requesting physician: Dr. Rupert Stacks _ _ _   _ __   _ __ _ _  __ __   _ __   __ _  History of Present Illness   This is a 76 yo woman with hx HL, hypothyroidism who presents with visual field deficit since Tuesday. She has poor vision at baseline 2/2 glaucoma which fluctuates and worsens when her "eye pressure is high." Starting on Tuesday however she noted that her peripheral vision on the R in both eyes has become severely impaired and has not returned. She told a family member today who brought her in for further evaluation. CT head in ED showed a large acute/subacute L occipital infarct with small amt of petechial hemorrhage. She takes a baby aspirin daily PTA. 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 MRI brain confirmed a large L PCA acute infarct without hemorrhage or mass effect. All CNS imaging personally reviewed.   ROS   Per HPI: all other systems reviewed and are negative  Past History   I have reviewed the following:  Past Medical History:  Diagnosis Date   Arthritis    hands   Bell's palsy    Elevated fasting blood sugar 01/28/2017   Glaucoma    Hyperlipidemia, unspecified 01/28/2017   Hypothyroidism    Hypothyroidism, unspecified 01/28/2017   Past Surgical History:  Procedure Laterality Date   CATARACT EXTRACTION  2017   CATARACT EXTRACTION W/PHACO Left 05/05/2017   Procedure: CATARACT EXTRACTION PHACO AND INTRAOCULAR LENS PLACEMENT ([place]) LEFT COMPLICATED;  Surgeon: Leandrew Koyanagi, MD;  Location: [place] [facility];  Service: Ophthalmology;  Laterality: Left;   EYE SURGERY     [facility] eye   1990   THYROIDECTOMY     TRABECULECTOMY Left 05/05/2017   Procedure: TRABECULECTOMY WITH [facility] AND EXPRESS SHUNT;  Surgeon: Leandrew Koyanagi, MD;  Location: [place];  Service:  Ophthalmology;  Laterality: Left;   Family History  Problem Relation Age of Onset   Heart attack Father    Pancreatic cancer Sister    Breast cancer Neg Hx    Social History   Socioeconomic History   Marital status: Married    Spouse name: Not on file   Number of children: Not on file   Years of education: Not on file   Highest education level: Not on file  Occupational History   Not on file  Tobacco Use   Smoking status: Former    Types: Cigarettes    Quit date: 02/01/2017    Years since quitting: 5.0   Smokeless tobacco: Never  Substance and Sexual Activity   Alcohol use: No   Drug use: No   Sexual activity: Not Currently  Other Topics Concern   Not on file  Social History Narrative   Not on file   Social Determinants of Health   Financial Resource Strain: Not on file  Food Insecurity: Not on file  Transportation Needs: Not on file  Physical Activity: Not on file  Stress: Not on file  Social Connections: Not on file   No Known Allergies  Medications   Medications Prior to Admission  Medication Sig Dispense Refill Last Dose   acetaminophen (TYLENOL) 500 MG tablet Take 500 mg by mouth every 6 (six) hours as needed for mild pain, moderate pain, fever or  headache.   PRN at PRN   aspirin EC 81 MG tablet Take by mouth.   03/04/2022 at NIGHT   brimonidine (ALPHAGAN) 0.2 % ophthalmic solution Place 1 drop into both eyes in the morning and at bedtime.   03/04/2022 at NIGHT   Cyanocobalamin (VITAMIN B 12 PO) Take by mouth.   03/04/2022 at NIGHT   dorzolamide-timolol (COSOPT) 22.3-6.8 MG/ML ophthalmic solution Place 1 drop into both eyes 2 (two) times daily in the am and at bedtime..   03/04/2022 at NIGHT   fluticasone (FLONASE) 50 MCG/ACT nasal spray USE TWO SPRAY(S) IN EACH NOSTRIL ONCE DAILY   Past Month at PRN   ibuprofen (ADVIL,MOTRIN) 200 MG tablet Take 800 mg by mouth at bedtime.   03/04/2022 at NIGHT   latanoprost (XALATAN) 0.005 % ophthalmic solution Place 1 drop into  both eyes at bedtime.   03/04/2022 at AFTERNOON   levothyroxine (SYNTHROID, LEVOTHROID) 112 MCG tablet Take by mouth.   03/04/2022 at NIGHT   Multiple Vitamin (MULTI-VITAMINS) TABS Take by mouth.   03/04/2022 at [place] 300 MG CAPS Take by mouth.   03/04/2022 at NIGHT   vitamin E 400 UNIT capsule Take by mouth.   03/04/2022 at NIGHT   brimonidine (ALPHAGAN P) 0.1 % SOLN Place 1 drop into both eyes 2 (two) times daily. (Patient not taking: Reported on 03/05/2022)   Not Taking   potassium chloride (KCL) 2 mEq/mL SOLN oral liquid Take by mouth. (Patient not taking: Reported on 03/05/2022)   Not Taking   timolol (BETIMOL) 0.25 % ophthalmic solution Place 1-2 drops into both eyes 2 (two) times daily. (Patient not taking: Reported on 03/05/2022)   Not Taking      Current Facility-Administered Medications:     stroke: early stages of recovery book, , Does not apply, Once, Cox, Amy N, DO   acetaminophen (TYLENOL) tablet 650 mg, 650 mg, Oral, Q4H PRN, 650 mg at 03/06/22 0520 **OR** acetaminophen (TYLENOL) 160 MG/5ML solution 650 mg, 650 mg, Per Tube, Q4H PRN **OR** acetaminophen (TYLENOL) suppository 650 mg, 650 mg, Rectal, Q4H PRN, Cox, Amy N, DO   aspirin chewable tablet 81 mg, 81 mg, Oral, Daily, Cox, Amy N, DO   brimonidine (ALPHAGAN) 0.2 % ophthalmic solution 1 drop, 1 drop, Both Eyes, BID, Cox, Amy N, DO   clopidogrel (PLAVIX) tablet 75 mg, 75 mg, Oral, Daily, Cox, Amy N, DO   dorzolamide-timolol (COSOPT) 22.3-6.8 MG/ML ophthalmic solution 1 drop, 1 drop, Both Eyes, BH-qamhs, Cox, Amy N, DO   fluticasone (FLONASE) 50 MCG/ACT nasal spray 2 spray, 2 spray, Each Nare, Daily PRN, Cox, Amy N, DO   hydrALAZINE (APRESOLINE) injection 5 mg, 5 mg, Intravenous, Q6H PRN, Cox, Amy N, DO   latanoprost (XALATAN) 0.005 % ophthalmic solution 1 drop, 1 drop, Both Eyes, QHS, Cox, Amy N, DO   levothyroxine (SYNTHROID) tablet 112 mcg, 112 mcg, Oral, Q0600, Cox, Amy N, DO, 112 mcg at 03/06/22 0520   polyethylene  glycol (MIRALAX / GLYCOLAX) packet 17 g, 17 g, Oral, Daily PRN, Cox, Amy N, DO   senna-docusate (Senokot-S) tablet 1 tablet, 1 tablet, Oral, QHS PRN, Cox, Amy N, DO  Vitals   Vitals:   03/05/22 1731 03/05/22 2047 03/06/22 0012 03/06/22 0505  BP: 132/74 138/74 (!) 103/59 114/64  Pulse: 72 68 60 (!) 57  Resp: '18 16 16 16  '$ Temp: 98.1 F (36.7 C) (!) 97.5 F (36.4 C) 97.7 F (36.5 C) 97.8 F (36.6 C)  TempSrc:  Oral Oral   SpO2: 99% 98% 99% 98%  Weight:      Height:         Body mass index is 18.7 kg/m.  Physical Exam   Physical Exam Gen: A&O x4, NAD HEENT: Atraumatic, normocephalic;mucous membranes moist; oropharynx clear, tongue without atrophy or fasciculations. Neck: Supple, trachea midline. Resp: CTAB, no w/r/r CV: RRR, no m/g/r; nml S1 and S2. 2+ symmetric peripheral pulses. Abd: soft/NT/ND; nabs x 4 quad Extrem: Nml bulk; no cyanosis, clubbing, or edema.  Neuro: *MS: A&O x4. Follows multi-step commands.  *Speech: fluid, nondysarthric, able to name and repeat *CN:    I: Deferred   II,III: PERRLA, R homonymous hemianopsia, optic discs unable to be visualized 2/2 pupillary constriction   III,IV,VI: EOMI w/o nystagmus, no ptosis   V: Sensation intact from V1 to V3 to LT   VII: Eyelid closure was full.  Smile symmetric.   VIII: Hearing intact to voice   IX,X: Voice normal, palate elevates symmetrically    XI: SCM/trap 5/5 bilat   XII: Tongue protrudes midline, no atrophy or fasciculations   *Motor:   Normal bulk.  No tremor, rigidity or bradykinesia. No pronator drift.    Strength: Dlt Bic Tri WrE WrF FgS Gr HF KnF KnE PlF DoF    Left '5 5 5 5 5 5 5 5 5 5 5 5    '$ Right '5 5 5 5 5 5 5 5 5 5 5 5    '$ *Sensory: Intact to light touch, pinprick, temperature vibration throughout. Symmetric. Propioception intact bilat.  No double-simultaneous extinction.  *Coordination:  Finger-to-nose, heel-to-shin, rapid alternating motions were intact. *Reflexes:  2+ and symmetric  throughout without clonus; toes down-going bilat *Gait: deferred  NIHSS = 2 for visual field deficit   Premorbid mRS = 0   Labs   CBC:  Recent Labs  Lab 03/05/22 0916  WBC 7.2  NEUTROABS 5.6  HGB 12.1  HCT 38.4  MCV 96.0  PLT 135*    Basic Metabolic Panel:  Lab Results  Component Value Date   NA 140 03/05/2022   K 4.0 03/05/2022   CO2 27 03/05/2022   GLUCOSE 97 03/05/2022   BUN 18 03/05/2022   CREATININE 0.74 03/05/2022   CALCIUM 9.5 03/05/2022   GFRNONAA >60 03/05/2022   Lipid Panel: No results found for: "LDLCALC" HgbA1c: No results found for: "HGBA1C" Urine Drug Screen: No results found for: "LABOPIA", "COCAINSCRNUR", "LABBENZ", "AMPHETMU", "THCU", "LABBARB"  Alcohol Level     Component Value Date/Time   ETH <10 03/05/2022 0916     Impression   This is a 76 yo woman with hx HL, hypothyroidism who presents with 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 She is outside the window for any intervention.  Recommendations   - Goal normotension 48 hrs out from sx onset, avoid hypotension - TTE w/ bubble - Check A1c and LDL + add statin per guidelines - ASA '81mg'$  daily + plavix '75mg'$  daily x21 days f/b plavix '75mg'$  daily monotherapy after that - q4 hr neuro checks - STAT head CT for any change in neuro exam - Tele - PT/OT/SLP - Stroke education - Amb referral to neurology upon discharge   Will continue to follow ______________________________________________________________________   Thank you for the opportunity to take part in the care of this patient. If you have any further questions, please contact the neurology consultation attending.  Signed,  Su Monks, MD [facility] [phone]  If 7pm- 7am, please page neurology on call as listed in [place].

## 2022-03-06 NOTE — Evaluation (Signed)
Physical Therapy Evaluation Patient Details Name: Erica Ingram MRN: [id number] DOB: [birth year] Today's Date: 03/06/2022  History of Present Illness  pt is a 76 year old female presenting with R  visual field deficit since Tuesday, admitted with L PCA ischemic infarct 2/2 L P3 occlusion; PMH significant for  HLD, arthritis, bells palsy, glaucoma, hypothyroud  Clinical Impression  Pt was pleasant and motivated to participate during the session and put forth good effort throughout. Pt is able to complete bed mobility and transfers very well without difficulty and demonstrates good functional strength. Pt's most notable impairment is balance when ambulating using no AD; has a slow gait and guarded with occasional sways but when ambulating with RW is more steady. Min cuing needed for visual scanning to ensure increased safety awareness with R visual field deficits. Of note, no asymmetrical deficits noted with MMT or sensation in BLE. Pt reports she feels at her baseline strength but more unsteady when walking/standing. Pt and family educated on using RW for better stability until cleared from future PT.  Pt will benefit from HHPT upon discharge to safely address deficits listed in patient problem list for decreased caregiver assistance and eventual return to PLOF.       Recommendations for follow up therapy are one component of a multi-disciplinary discharge planning process, led by the attending physician.  Recommendations may be updated based on patient status, additional functional criteria and insurance authorization.  Follow Up Recommendations Home health PT      Assistance Recommended at Discharge Intermittent Supervision/Assistance  Patient can return home with the following  A little help with walking and/or transfers;A little help with bathing/dressing/bathroom;Assistance with cooking/housework;Assist for transportation;Help with stairs or ramp for entrance    Equipment Recommendations  None recommended by PT  Recommendations for Other Services       Functional Status Assessment       Precautions / Restrictions Precautions Precautions: Fall Restrictions Weight Bearing Restrictions: No      Mobility  Bed Mobility Overal bed mobility: Needs Assistance Bed Mobility: Supine to Sit     Supine to sit: Modified independent (Device/Increase time)          Transfers Overall transfer level: Needs assistance Equipment used: Rolling walker (2 wheels) Transfers: Sit to/from Stand Sit to Stand: Supervision                Ambulation/Gait Ambulation/Gait assistance: Supervision Gait Distance (Feet): 80 Feet Assistive device: Rolling walker (2 wheels), None Gait Pattern/deviations: Step-through pattern, Decreased step length - right, Decreased step length - left Gait velocity: decreased     General Gait Details: steady gait using RW and able to ambulate using no AD but with more decreased speed and guarded with occasional swaying  but no LOB  Stairs            Wheelchair Mobility    Modified Rankin (Stroke Patients Only)       Balance Overall balance assessment: Needs assistance Sitting-balance support: Feet supported Sitting balance-Leahy Scale: Good     Standing balance support: Bilateral upper extremity supported, No upper extremity supported, During functional activity Standing balance-Leahy Scale: Fair   Single Leg Stance - Right Leg: 5 Single Leg Stance - Left Leg: 5 Modified Tandem Stance - Right Leg: 10 Modified Tandem Stance - Left Leg: 10       High Level Balance Comments: has some minor LOB in varying BOS             Pertinent Vitals/Pain  Pain Assessment Pain Assessment: No/denies pain    Home Living Family/patient expects to be discharged to:: Private residence Living Arrangements: Spouse/significant other Available Help at Discharge: Family;Available 24 hours/day Type of Home: House Home Access: Stairs to  enter Entrance Stairs-Rails: Left Entrance Stairs-Number of Steps: 1-2   Home Layout: One level Home Equipment: Conservation officer, nature (2 wheels);Cane - single point;Shower seat      Prior Function Prior Level of Function : Independent/Modified Independent             Mobility Comments: indep no AD, hx 1 fall from LOB per family  ADLs Comments: MOD I-I in ADL/IADL, cooks, cleans, drives, helps care for her husband     Hand Dominance        Extremity/Trunk Assessment   Upper Extremity Assessment Upper Extremity Assessment: Overall WFL for tasks assessed    Lower Extremity Assessment Lower Extremity Assessment: Overall WFL for tasks assessed    Cervical / Trunk Assessment Cervical / Trunk Assessment: Normal  Communication   Communication: No difficulties  Cognition Arousal/Alertness: Awake/alert Behavior During Therapy: WFL for tasks assessed/performed Overall Cognitive Status: Within Functional Limits for tasks assessed                                          General Comments      Exercises     Assessment/Plan    PT Assessment Patient needs continued PT services  PT Problem List Decreased strength;Decreased mobility;Decreased activity tolerance;Decreased balance;Decreased knowledge of use of DME       PT Treatment Interventions Therapeutic exercise;DME instruction;Gait training;Balance training;Stair training;Functional mobility training;Patient/family education;Therapeutic activities    PT Goals (Current goals can be found in the Care Plan section)  Acute Rehab PT Goals Patient Stated Goal: Pt would like to rebuild her strength back up PT Goal Formulation: With patient Time For Goal Achievement: 03/19/22 Potential to Achieve Goals: Good    Frequency Min 2X/week     Co-evaluation               AM-PAC PT "6 Clicks" Mobility  Outcome Measure Help needed turning from your back to your side while in a flat bed without using  bedrails?: None Help needed moving from lying on your back to sitting on the side of a flat bed without using bedrails?: None Help needed moving to and from a bed to a chair (including a wheelchair)?: None Help needed standing up from a chair using your arms (e.g., wheelchair or bedside chair)?: None Help needed to walk in hospital room?: A Little Help needed climbing 3-5 steps with a railing? : A Little 6 Click Score: 22    End of Session Equipment Utilized During Treatment: Gait belt Activity Tolerance: Patient tolerated treatment well Patient left: in chair;with call bell/phone within reach;with chair alarm set;with family/visitor present Nurse Communication: Mobility status PT Visit Diagnosis: Other symptoms and signs involving the nervous system (R29.898);Unsteadiness on feet (R26.81);Muscle weakness (generalized) (M62.81)    Time: 4665-9935 PT Time Calculation (min) (ACUTE ONLY): 24 min   Charges:              Turner Daniels, SPT  03/06/2022, 11:40 AM

## 2022-03-06 NOTE — Discharge Summary (Signed)
Physician Discharge Summary  Erica Ingram EHM:[id number] DOB: 1946-05-11 DOA: 03/05/2022  PCP: Sofie Hartigan, MD  Admit date: 03/05/2022 Discharge date: 03/06/2022 Recommendations for Outpatient Follow-up:  Follow up with PCP in 1 weeks-call for appointment Please obtain BMP/CBC in one week Outpatient neurology follow-up  Discharge Dispo: home Discharge Condition: Stable Code Status:   Code Status: Full Code Diet recommendation:  Diet Order             Diet regular Room service appropriate? Yes; Fluid consistency: Thin  Diet effective now                    Brief/Interim Summary: 76 year old female with history of hypothyroid, who presents emergency department for chief concerns of left-sided headache, dizziness, and imbalance.Initial vitals in the emergency department showed temperature of 99, respiration rate of 16, heart rate of 61, blood pressure 148/79, SPO2 100% on room air. Serum sodium is 140, potassium 4.0, chloride 106, bicarb 27, BUN of 18, serum creatinine of 0.74, GFR greater than 60, nonfasting blood glucose 97, WBC 7.2, hemoglobin 12.1, platelets of 135. EtOH was negative in the emergency department. CT of the head without contrast: Read as findings compatible with acute or subacute left PCA territory infarct.  Possible subtle associated petechial hemorrhage without mass occupying acute hemorrhage.  No significant mass effect.  Hyperdense appearance of the left P2 PCA, suspicious for thrombus given the above findings. ED treatment: Aspirin 325 mg p.o. 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  Seen by neurology-underwent further extensive work-up LDL 112 goal less than 70. Hba1c 5.4.CT angio with left P3 occlusion moderate to severe left vertebral artery origin stenosis, mild cervical carotid artery atherosclerosis.Per neurology aspirin 81 and Plavix 75 mg x 21 days followed by Plavix 75 mg  monotherapy, statin as per guideline PT OT speech during admission stroke education and outpatient neurology follow-up was advised. Patient reports she has baseline asymptomatic bradycardia.  Echo came back without any significant finding negative bubble study.  Discussed with Dr. Quinn Axe for discharge home.   Discharge Diagnoses:  Principal Problem:   Stroke [facility]) Active Problems:   Hyperlipidemia   Hypothyroidism   Thrombopenia ([place])  Assessment and Plan: * Stroke acute involving left PCA, complete stroke work-up with CT angio neck, MRI head, lipid panel, A1c, LDL echocardiogram.  Plan is for 3 weeks of DAPT following with Plavix monotherapy along with Lipitor 40 outpatient follow-up with neurology and PCP.  Okay for discharge home.  Discussed with neurology.  Plan for home health.    Thrombopenia stable Recent Labs  Lab 03/05/22 0916  HGB 12.1  HCT 38.4  WBC 7.2  PLT 135*   Hypothyroidism: cont Levothyroxine 112 mcg daily in the morning Hyperlipidemia: started atorvastatin 40 mg Consults: neurology Subjective: Alert awake oriented some visual field defect present,  Discharge Exam: Vitals:   03/06/22 0819 03/06/22 1142  BP: 139/78 119/62  Pulse: (!) 57 66  Resp: 18 18  Temp: 98.5 F (36.9 C) 98.3 F (36.8 C)  SpO2: 99% 100%   General: Pt is alert, awake, not in acute distress Cardiovascular: RRR, S1/S2 +, no rubs, no gallops Respiratory: CTA bilaterally, no wheezing, no rhonchi Abdominal: Soft, NT, ND, bowel sounds + Extremities: no edema, no cyanosis  Discharge Instructions  Discharge Instructions     Ambulatory referral to Neurology   Complete by: As directed    Discharge instructions  Complete by: As directed    Please call call MD or return to ER for similar or worsening recurring problem that brought you to hospital or if any fever,nausea/vomiting,abdominal pain, uncontrolled pain, chest pain,  shortness of breath or any other alarming symptoms.  Please  follow-up your doctor as instructed in a week time and call the office for appointment.  Please avoid alcohol, smoking, or any other illicit substance and maintain healthy habits including taking your regular medications as prescribed.  You were cared for by a hospitalist during your hospital stay. If you have any questions about your discharge medications or the care you received while you were in the hospital after you are discharged, you can call the unit and ask to speak with the hospitalist on call if the hospitalist that took care of you is not available.  Once you are discharged, your primary care physician will handle any further medical issues. Please note that NO REFILLS for any discharge medications will be authorized once you are discharged, as it is imperative that you return to your primary care physician (or establish a relationship with a primary care physician if you do not have one) for your aftercare needs so that they can reassess your need for medications and monitor your lab values   Increase activity slowly   Complete by: As directed       Allergies as of 03/06/2022   No Known Allergies      Medication List     STOP taking these medications    potassium chloride 2 mEq/mL Soln oral liquid Commonly known as: KCl       TAKE these medications    acetaminophen 500 MG tablet Commonly known as: TYLENOL Take 500 mg by mouth every 6 (six) hours as needed for mild pain, moderate pain, fever or headache.   aspirin EC 81 MG tablet Take 1 tablet (81 mg total) by mouth daily for 21 days. What changed:  how much to take when to take this   atorvastatin 40 MG tablet Commonly known as: Lipitor Take 1 tablet (40 mg total) by mouth daily.   brimonidine 0.2 % ophthalmic solution Commonly known as: ALPHAGAN Place 1 drop into both eyes in the morning and at bedtime. What changed: Another medication with the same name was removed. Continue taking this medication, and  follow the directions you see here.   clopidogrel 75 MG tablet Commonly known as: PLAVIX Take 1 tablet (75 mg total) by mouth daily. Start taking on: March 07, 2022   dorzolamide-timolol 22.3-6.8 MG/ML ophthalmic solution Commonly known as: COSOPT Place 1 drop into both eyes 2 (two) times daily in the am and at bedtime..   fluticasone 50 MCG/ACT nasal spray Commonly known as: FLONASE USE TWO SPRAY(S) IN EACH NOSTRIL ONCE DAILY   ibuprofen 200 MG tablet Commonly known as: ADVIL Take 800 mg by mouth at bedtime.   latanoprost 0.005 % ophthalmic solution Commonly known as: XALATAN Place 1 drop into both eyes at bedtime.   levothyroxine 112 MCG tablet Commonly known as: SYNTHROID Take by mouth.   Multi-Vitamins Tabs Take by mouth.   St Johns Wort 300 MG Caps Take by mouth.   timolol 0.25 % ophthalmic solution Commonly known as: BETIMOL Place 1-2 drops into both eyes 2 (two) times daily.   VITAMIN B 12 PO Take by mouth.   vitamin E 180 MG (400 UNITS) capsule Take by mouth.        Follow-up Information  Sofie Hartigan, MD Follow up in 1 week(s).   Specialty: Family Medicine Contact information: [address] [place] [address] [phone]                No Known Allergies  The results of significant diagnostics from this hospitalization (including imaging, microbiology, ancillary and laboratory) are listed below for reference.    Microbiology: No results found for this or any previous visit (from the past 240 hour(s)).  Procedures/Studies: ECHOCARDIOGRAM COMPLETE BUBBLE STUDY  Result Date: 03/06/2022    ECHOCARDIOGRAM REPORT   Patient Name:   Erica Ingram [facility] Date of Exam: 03/06/2022 Medical Rec #:  [id number]        Height:       68.0 in Accession #:    [id number]       Weight:       123.0 lb Date of Birth:  1946-01-30        BSA:          1.662 m Patient Age:    54 years         BP:           114/64 mmHg Patient Gender: F                HR:            57 bpm. Exam Location:  [facility] Procedure: 2D Echo, Cardiac Doppler, Color Doppler and Saline Contrast Bubble            Study Indications:     Stroke 434.91 I63.9  History:         Patient has no prior history of Echocardiogram examinations.                  Risk Factors:Dyslipidemia.  Sonographer:     Sherrie Sport Referring Phys:  [id number] AMY N COX Diagnosing Phys: Yolonda Kida MD  Sonographer Comments: Suboptimal apical window and no subcostal window. IMPRESSIONS  1. Negative bubble study.  2. Left ventricular ejection fraction, by estimation, is 65 to 70%. The left ventricle has normal function. The left ventricle has no regional wall motion abnormalities. There is mild left ventricular hypertrophy. Left ventricular diastolic parameters are consistent with Grade I diastolic dysfunction (impaired relaxation).  3. Right ventricular systolic function is normal. The right ventricular size is normal.  4. The mitral valve is normal in structure. Trivial mitral valve regurgitation.  5. The aortic valve is normal in structure. Aortic valve regurgitation is not visualized. FINDINGS  Left Ventricle: Left ventricular ejection fraction, by estimation, is 65 to 70%. The left ventricle has normal function. The left ventricle has no regional wall motion abnormalities. The left ventricular internal cavity size was normal in size. There is  mild left ventricular hypertrophy. Left ventricular diastolic parameters are consistent with Grade I diastolic dysfunction (impaired relaxation). Right Ventricle: The right ventricular size is normal. No increase in right ventricular wall thickness. Right ventricular systolic function is normal. Left Atrium: Left atrial size was normal in size. Right Atrium: Right atrial size was normal in size. Pericardium: There is no evidence of pericardial effusion. Mitral Valve: The mitral valve is normal in structure. Trivial mitral valve regurgitation. Tricuspid Valve: The tricuspid valve is  normal in structure. Tricuspid valve regurgitation is trivial. Aortic Valve: The aortic valve is normal in structure. Aortic valve regurgitation is not visualized. Aortic valve mean gradient measures 2.0 mmHg. Aortic valve peak gradient measures 4.5 mmHg. Aortic valve area, by VTI measures  2.43 cm. Pulmonic Valve: The pulmonic valve was normal in structure. Pulmonic valve regurgitation is not visualized. Aorta: The ascending aorta was not well visualized. IAS/Shunts: No atrial level shunt detected by color flow Doppler. Agitated saline contrast was given intravenously to evaluate for intracardiac shunting. Additional Comments: Negative bubble study.  LEFT VENTRICLE PLAX 2D LVIDd:         3.20 cm   Diastology LVIDs:         1.80 cm   LV e' medial:    5.44 cm/s LV PW:         1.40 cm   LV E/e' medial:  14.7 LV IVS:        1.10 cm   LV e' lateral:   13.80 cm/s LVOT diam:     2.00 cm   LV E/e' lateral: 5.8 LV SV:         49 LV SV Index:   29 LVOT Area:     3.14 cm  RIGHT VENTRICLE RV Basal diam:  3.70 cm RV S prime:     12.40 cm/s LEFT ATRIUM             Index        RIGHT ATRIUM           Index LA diam:        2.50 cm 1.50 cm/m   RA Area:     24.80 cm LA Vol (A2C):   26.9 ml 16.18 ml/m  RA Volume:   88.00 ml  52.94 ml/m LA Vol (A4C):   29.7 ml 17.87 ml/m LA Biplane Vol: 28.6 ml 17.21 ml/m  AORTIC VALVE AV Area (Vmax):    2.23 cm AV Area (Vmean):   2.18 cm AV Area (VTI):     2.43 cm AV Vmax:           106.00 cm/s AV Vmean:          67.000 cm/s AV VTI:            0.200 m AV Peak Grad:      4.5 mmHg AV Mean Grad:      2.0 mmHg LVOT Vmax:         75.10 cm/s LVOT Vmean:        46.400 cm/s LVOT VTI:          0.155 m LVOT/AV VTI ratio: 0.77  AORTA Ao Root diam: 3.70 cm MITRAL VALVE               TRICUSPID VALVE MV Area (PHT): 3.16 cm    TR Peak grad:   28.5 mmHg MV Decel Time: 240 msec    TR Vmax:        267.00 cm/s MV E velocity: 80.10 cm/s MV A velocity: 93.80 cm/s  SHUNTS MV E/A ratio:  0.85        Systemic  VTI:  0.16 m                            Systemic Diam: 2.00 cm Yolonda Kida MD Electronically signed by Yolonda Kida MD Signature Date/Time: 03/06/2022/12:40:38 PM    Final    MR BRAIN WO CONTRAST  Result Date: 03/05/2022 CLINICAL DATA:  Headache and dizziness EXAM: MRI HEAD WITHOUT CONTRAST TECHNIQUE: Multiplanar, multiecho pulse sequences of the brain and surrounding structures were obtained without intravenous contrast. COMPARISON:  None Available. FINDINGS: Brain: There is a large left PCA territory acute  infarct. No mass effect. Fewer than 5 scattered microhemorrhages in a nonspecific pattern. Aside from the edema in the left PCA territory, parenchymal signal is normal. The midline structures are normal. Vascular: Major flow voids are preserved. Skull and upper cervical spine: Normal calvarium and skull base. Visualized upper cervical spine and soft tissues are normal. Sinuses/Orbits:No paranasal sinus fluid levels or advanced mucosal thickening. No mastoid or middle ear effusion. Normal orbits. IMPRESSION: Large left PCA territory acute infarct without hemorrhage or mass effect. Electronically Signed   By: Ulyses Jarred M.D.   On: 03/05/2022 22:02   CT ANGIO HEAD NECK W WO CM  Result Date: 03/05/2022 CLINICAL DATA:  Stroke follow-up. Left PCA infarct on earlier noncontrast head CT. EXAM: CT ANGIOGRAPHY HEAD AND NECK TECHNIQUE: Multidetector CT imaging of the head and neck was performed using the standard protocol during bolus administration of intravenous contrast. Multiplanar CT image reconstructions and MIPs were obtained to evaluate the vascular anatomy. Carotid stenosis measurements (when applicable) are obtained utilizing NASCET criteria, using the distal internal carotid diameter as the denominator. RADIATION DOSE REDUCTION: This exam was performed according to the departmental dose-optimization program which includes automated exposure control, adjustment of the mA and/or kV according  to patient size and/or use of iterative reconstruction technique. CONTRAST:  29m OMNIPAQUE IOHEXOL 350 MG/ML SOLN COMPARISON:  Head CT 03/05/2022.  Neck CT 02/23/2017. FINDINGS: CTA NECK FINDINGS Aortic arch: Incomplete imaging of the aortic arch with exclusion of the brachiocephalic and left common carotid artery origins. Mild stenosis of the right subclavian artery origin due to calcified plaque. Eccentric, predominantly soft plaque in the left subclavian artery resulting in less than 50% stenosis. Right carotid system: Patent with a small amount of predominantly calcified plaque at the carotid bifurcation and in the proximal ICA. No evidence of a significant stenosis or dissection. Retropharyngeal course of the proximal to mid cervical ICA. Left carotid system: Patent with a small amount of calcified and soft plaque in the distal common carotid artery. No evidence of a significant stenosis or dissection. Vertebral arteries: Patent and codominant without evidence of a significant stenosis on the right. Moderate to severe stenosis of the left vertebral origin. Skeleton: Cervical disc degeneration, most advanced at C5-6 and C6-7 where there is severe disc space narrowing and mild posterior longitudinal ligament ossification. No evidence of high-grade spinal stenosis. Other neck: No evidence of cervical lymphadenopathy or mass. Upper chest: Mild biapical pleuroparenchymal lung scarring. Review of the MIP images confirms the above findings CTA HEAD FINDINGS Anterior circulation: The internal carotid arteries are widely patent from skull base to carotid termini. ACAs and MCAs are patent without evidence of a proximal branch occlusion or significant proximal stenosis. No aneurysm is identified. Posterior circulation: The intracranial vertebral arteries are widely patent to the basilar. Patent PICA, AICA, and SCA origins are seen bilaterally. The basilar artery is widely patent. Both PCAs are patent proximally. There is  a left P3 occlusion. No aneurysm is identified. Venous sinuses: Diminished opacification of the left sigmoid sinus favored to be due to arterial contrast timing. Anatomic variants: None. Review of the MIP images confirms the above findings IMPRESSION: 1. Left P3 occlusion. 2. 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 3. Mild cervical carotid artery atherosclerosis without significant stenosis. Electronically Signed   By: ALogan BoresM.D.   On: 03/05/2022 14:32   DG Chest Port 1 View  Result Date: 03/05/2022 CLINICAL DATA:  Stroke.  Former smoker. EXAM: PORTABLE CHEST 1 VIEW COMPARISON:  PA chest with left rib radiographs 08/07/2013 FINDINGS: Cardiac silhouette is at the upper limits of normal size. Mild calcification within the aortic arch. Mild flattening of the diaphragms and mild hyperinflation. Increased lucencies are again seen within the upper lungs compatible with chronic emphysematous changes. The lungs are clear. No pleural effusion or pneumothorax. Mild levocurvature of the lower thoracic spine with moderate right T10-11 disc space narrowing. IMPRESSION: No acute cardiopulmonary disease process. Electronically Signed   By: Yvonne Kendall M.D.   On: 03/05/2022 12:47   CT HEAD WO CONTRAST  Result Date: 03/05/2022 CLINICAL DATA:  Dizziness, persistent/recurrent, cardiac or vascular cause suspected EXAM: CT HEAD WITHOUT CONTRAST TECHNIQUE: Contiguous axial images were obtained from the base of the skull through the vertex without intravenous contrast. RADIATION DOSE REDUCTION: This exam was performed according to the departmental dose-optimization program which includes automated exposure control, adjustment of the mA and/or kV according to patient size and/or use of iterative reconstruction technique. COMPARISON:  None Available. FINDINGS: Brain: Hypoattenuation and loss of gray differentiation involving the left PCA territory including the left occipital lobe, compatible with acute or  subacute infarct.Possible subtle associated petechial hemorrhage without mass occupying acute hemorrhage. No significant mass effect. No midline shift. No hydrocephalus. No mass lesion. Vascular: Calcific intracranial atherosclerosis. Skull: No acute fracture. Sinuses/Orbits: Clear sinuses.  No acute findings. Other: No mastoid effusions. IMPRESSION: 1. Findings compatible with acute or subacute left PCA territory infarct. Possible subtle associated petechial hemorrhage without mass occupying acute hemorrhage. No significant mass effect. Recommend MRI to confirm and further characterize. 2. Hyperdense appearance of the left P2 PCA, suspicious for thrombus given the above findings. Recommend CTA or MRA to evaluate. Findings discussed with Dr. Quentin Cornwall via telephone at 10 a.m. Electronically Signed   By: Margaretha Sheffield M.D.   On: 03/05/2022 10:01    Labs: BNP (last 3 results) No results for input(s): "BNP" in the last 8760 hours. Basic Metabolic Panel: Recent Labs  Lab 03/05/22 0916  NA 140  K 4.0  CL 106  CO2 27  GLUCOSE 97  BUN 18  CREATININE 0.74  CALCIUM 9.5   Liver Function Tests: Recent Labs  Lab 03/05/22 0916  AST 21  ALT 14  ALKPHOS 67  BILITOT 1.0  PROT 7.0  ALBUMIN 4.2   No results for input(s): "LIPASE", "AMYLASE" in the last 168 hours. No results for input(s): "AMMONIA" in the last 168 hours. CBC: Recent Labs  Lab 03/05/22 0916  WBC 7.2  NEUTROABS 5.6  HGB 12.1  HCT 38.4  MCV 96.0  PLT 135*   Cardiac Enzymes: No results for input(s): "CKTOTAL", "CKMB", "CKMBINDEX", "TROPONINI" in the last 168 hours. BNP: Invalid input(s): "POCBNP" CBG: No results for input(s): "GLUCAP" in the last 168 hours. D-Dimer No results for input(s): "DDIMER" in the last 72 hours. Hgb A1c Recent Labs    03/06/22 0522  HGBA1C 5.4   Lipid Profile Recent Labs    03/06/22 0522  CHOL 181  HDL 56  LDLCALC 112*  TRIG 63  CHOLHDL 3.2   Thyroid function studies No results  for input(s): "TSH", "T4TOTAL", "T3FREE", "THYROIDAB" in the last 72 hours.  Invalid input(s): "FREET3" Anemia work up Recent Labs    03/05/22 1243  VITAMINB12 1,791*   Urinalysis No results found for: "COLORURINE", "APPEARANCEUR", "LABSPEC", "PHURINE", "GLUCOSEU", "HGBUR", "BILIRUBINUR", "KETONESUR", "PROTEINUR", "UROBILINOGEN", "NITRITE", "LEUKOCYTESUR" Sepsis Labs Recent Labs  Lab 03/05/22 0916  WBC 7.2   Microbiology No results found for this or any previous visit (  from the past 240 hour(s)).  Time coordinating discharge: 25 minutes  SIGNED: Antonieta Pert, MD  [facility] 03/06/2022, 2:36 PM  If 7PM-7AM, please contact night-coverage [email or web address]

## 2022-03-06 NOTE — Plan of Care (Signed)

## 2022-03-06 NOTE — Progress Notes (Signed)
Initial Nutrition Assessment  DOCUMENTATION CODES:   Not applicable  INTERVENTION:   -MVI with minerals daily -Ensure Enlive po BID, each supplement provides 350 kcal and 20 grams of protein -Liberalize diet to regular for widest variety of meal selections  NUTRITION DIAGNOSIS:   Increased nutrient needs related to acute illness as evidenced by estimated needs.  GOAL:   Patient will meet greater than or equal to 90% of their needs  MONITOR:   PO intake, Supplement acceptance  REASON FOR ASSESSMENT:   Malnutrition Screening Tool    ASSESSMENT:   Pt with history of hypothyroid, who presents for chief concerns of left-sided headache, dizziness, and imbalance.  Pt admitted with stroke (lt PCA infarct with P3 occlusion).    Reviewed I/O's: +240 ml x 24 hours  Pt working with physical therapy at time of visit. Unable to complete nutrition-focused physical exam at this time.    Pt currently on a heart healthy diet. Noted meal completions 100%.Per nutrition screen, pt follows a ketogenic diet at home, as this is what her husband follows.   Reviewed wt hx; pt has experienced a 10.9% wt loss over the past 10 months. This is not significant for time frame, however, concerning given older age and restrictive diet at home.  Pt with increased nutritional needs and would benefit from addition of oral nutrition supplements.   Labs reviewed.    Diet Order:   Diet Order             Diet Heart Room service appropriate? Yes; Fluid consistency: Thin  Diet effective now                   EDUCATION NEEDS:   No education needs have been identified at this time  Skin:  Skin Assessment: Reviewed RN Assessment  Last BM:  03/04/22  Height:   Ht Readings from Last 1 Encounters:  03/05/22 '5\' 8"'$  (1.727 m)    Weight:   Wt Readings from Last 1 Encounters:  03/05/22 55.8 kg    Ideal Body Weight:  63.6 kg  BMI:  Body mass index is 18.7 kg/m.  Estimated Nutritional  Needs:   Kcal:  1650-1850  Protein:  75-90 grams  Fluid:  > 1.6 L    Loistine Chance, RD, LDN, [place] Registered Dietitian II Certified Diabetes Care and Education Specialist Please refer to [facility] for RD and/or RD on-call/weekend/after hours pager

## 2022-03-06 NOTE — Progress Notes (Signed)
Received MD order to discharge patient to home with Home Health.  Reviewed discharge instructions, home meds, prescriptions and follow up appointments with patient and patient verbalized understanding.

## 2022-03-06 NOTE — Evaluation (Signed)
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 Pt is alert and oriented x4, slightly flat affect throughout. PTA pt was MOD I-I in ADL/IADL, cooks, cleans, drives and helps care for husband. Pt son and daughter in room report pt will have 24/7 assist if needed. Pt is able to perform STS with CGA-MIN A, amb to bathroom with intermittent hand held assist, toilet transfer with CGA. Pt presents with a R visual field cut, has glaucoma at baseline. Pt performs 15/15 on both CLOX I and II assessments, which is [facility] for age. Pt noted to be turning head to the right to compensate for visual deficits. Hand out provided re: compensatory strategies for visual deficits. Pt would benefit from acute OT to address visual and ADL based deficits, recommend home with home health OT, referral to neuro-opthomologist if appropriate. Pt is left in bedside chair, NAD, all needs met. OT will follow acutely.      Recommendations for follow up therapy are one component of a multi-disciplinary discharge planning process, led by the attending physician.  Recommendations may be updated based on patient status, additional functional criteria and insurance authorization.   Follow Up Recommendations  Home health OT ; neuro-opthomologist   Assistance Recommended at Discharge Intermittent Supervision/Assistance  Patient can return home with the following A little help with walking and/or transfers;A little help with bathing/dressing/bathroom    Functional Status Assessment  Patient has had a recent decline in their functional  status and demonstrates the ability to make significant improvements in function in a reasonable and predictable amount of time.  Equipment Recommendations       Recommendations for Other Services       Precautions / Restrictions Precautions Precautions: Fall Restrictions Weight Bearing Restrictions: No      Mobility Bed Mobility               General bed mobility comments: NT pt in bed pre/post session    Transfers Overall transfer level: Needs assistance Equipment used: Rolling walker (2 wheels) Transfers: Sit to/from Stand Sit to Stand: Supervision                  Balance Overall balance assessment: Needs assistance Sitting-balance support: Feet supported Sitting balance-Leahy Scale: Good     Standing balance support: Bilateral upper extremity supported, During functional activity, No upper extremity supported Standing balance-Leahy Scale: Fair                             ADL either performed or assessed with clinical judgement   ADL Overall ADL's : Needs assistance/impaired                         Toilet Transfer: Min guard;Ambulation;Regular Toilet;Minimal assistance   Toileting- Clothing Manipulation and Hygiene: Min guard Toileting - Clothing Manipulation Details (indicate cue type and reason): anticipated     Functional mobility during ADLs: Min guard;Minimal assistance       Vision Baseline Vision/History: 3 Glaucoma Vision Assessment?: Yes Ocular Range of Motion: Restricted on the right  Tracking/Visual Pursuits: Right eye does not track laterally Visual Fields: Right homonymous hemianopsia     Perception Perception Perception: Within Functional Limits   Praxis Praxis Praxis: Intact    Pertinent Vitals/Pain Pain Assessment Pain Assessment: No/denies pain     Hand Dominance     Extremity/Trunk Assessment Upper Extremity Assessment Upper Extremity Assessment: Overall WFL for tasks assessed   Lower  Extremity Assessment Lower Extremity Assessment: Overall WFL for tasks assessed   Cervical / Trunk Assessment Cervical / Trunk Assessment: Normal   Communication Communication Communication: No difficulties   Cognition Arousal/Alertness: Awake/alert Behavior During Therapy: WFL for tasks assessed/performed Overall Cognitive Status: Within Functional Limits for tasks assessed                                       General Comments       Exercises Other Exercises Other Exercises: edu pt and children re: compensatory strategies for visual deficits, ADL safety, falls prevention   Shoulder Instructions      Home Living Family/patient expects to be discharged to:: Private residence Living Arrangements: Spouse/significant other Available Help at Discharge: Family;Available 24 hours/day Type of Home: House Home Access: Stairs to enter [employer] of Steps: 1-2 Entrance Stairs-Rails: Left Home Layout: One level     Bathroom Shower/Tub: Teacher, early years/pre: Handicapped height Bathroom Accessibility: Yes   Home Equipment: Conservation officer, nature (2 wheels);Cane - single point;Shower seat          Prior Functioning/Environment Prior Level of Function : Independent/Modified Independent             Mobility Comments: indep no AD, ADLs Comments: MOD I-I in ADL/IADL, cooks, cleans, drives, helps care for her husband        OT Problem List: Impaired vision/perception;Decreased knowledge of use of DME or AE;Impaired balance (sitting and/or standing);Decreased activity tolerance      OT Treatment/Interventions: Self-care/ADL training;Therapeutic exercise;Patient/family education;Energy conservation;Therapeutic activities;DME and/or AE instruction;Balance training    OT Goals(Current goals can be found in the care plan section) Acute Rehab OT Goals Patient Stated Goal: go home OT Goal Formulation: With patient/family Time For Goal  Achievement: 03/20/22 Potential to Achieve Goals: Good ADL Goals Pt Will Perform Grooming: with modified independence;standing Pt Will Perform Toileting - Clothing Manipulation and hygiene: with modified independence;sit to/from stand  OT Frequency: Min 4X/week    Co-evaluation              AM-PAC OT "6 Clicks" Daily Activity     Outcome Measure Help from another person eating meals?: None Help from another person taking care of personal grooming?: A Little Help from another person toileting, which includes using toliet, bedpan, or urinal?: A Little Help from another person bathing (including washing, rinsing, drying)?: A Little Help from another person to put on and taking off regular upper body clothing?: A Little Help from another person to put on and taking off regular lower body clothing?: A Little 6 Click Score: 19   End of Session Nurse Communication: Mobility status  Activity Tolerance: Patient tolerated treatment well Patient left: in chair;with call bell/phone within reach  OT Visit Diagnosis: Other symptoms and signs involving the nervous system (J82.505)                Time: 3976-7341 OT Time Calculation (min): 20 min Charges:  OT General Charges $OT Visit: 1 Visit OT Evaluation $OT Eval  Moderate Complexity: 1 Mod  Shanon Payor, OTD OTR/L  03/06/22, 12:14 PM

## 2022-03-06 NOTE — TOC Initial Note (Addendum)
Transition of Care [facility]) - Initial/Assessment Note    Patient Details  Name: Erica Ingram MRN: [id number] Date of Birth: [birth year]  Transition of Care [facility]) CM/SW Contact:    Eileen Stanford, LCSW Phone Number: 03/06/2022, 3:14 PM  Clinical Narrative:   CSW spoke with pt and pt is agreeable to [facility] with no agency preference. Pt states she has a walker at home and states she has no other DME needs at this time.   Referral given to [facility] with [facility]               Expected Discharge Plan: [place] Barriers to Discharge: No Barriers Identified   Patient Goals and CMS Choice Patient states their goals for this hospitalization and ongoing recovery are:: to get better      Expected Discharge Plan and Services Expected Discharge Plan: [place] Acute Care Choice: [place] arrangements for the past 2 months: Single Family Home Expected Discharge Date: 03/06/22                                    Prior Living Arrangements/Services Living arrangements for the past 2 months: Single Family Home Lives with:: Spouse Patient language and need for interpreter reviewed:: Yes Do you feel safe going back to the place where you live?: Yes      Need for Family Participation in Patient Care: Yes (Comment) Care giver support system in place?: Yes (comment)   Criminal Activity/Legal Involvement Pertinent to Current Situation/Hospitalization: No - Comment as needed  Activities of Daily Living Home Assistive Devices/Equipment: Eyeglasses ADL Screening (condition at time of admission) Patient's cognitive ability adequate to safely complete daily activities?: Yes (although patient is s/p stroke) Is the patient deaf or have difficulty hearing?: No Does the patient have difficulty seeing, even when wearing glasses/contacts?: Yes (s/p stroke - blurry vision even with glasses on and spots to Right Eye) Does the patient have  difficulty concentrating, remembering, or making decisions?: No Patient able to express need for assistance with ADLs?: Yes (stand by assist since s/p stroke. Usually very independent but now requires assistance.) Does the patient have difficulty dressing or bathing?: Yes (due to recent stroke. Usually very independent.) Independently performs ADLs?: No (s/p stroke. Usually very independent.) Communication: Independent Dressing (OT): Independent (but needing a little assistance s/p stroke) Grooming: Independent (might need assistance now since s/p stroke) Feeding: Independent Bathing: Independent (might need stand by assist now s/p stroke) Toileting: Independent In/Out Bed: Independent Walks in Home: Independent Does the patient have difficulty walking or climbing stairs?: No (but will need stand by assist now that s/p stroke) Weakness of Legs: None Weakness of Arms/Hands: None  Permission Sought/Granted Permission sought to share information with : Family Supports Permission granted to share information with : Yes, Verbal Permission Granted  Share Information with NAME: Purcell Nails     Permission granted to share info w Relationship: spouse     Emotional Assessment Appearance:: Appears stated age Attitude/Demeanor/Rapport: Engaged Affect (typically observed): Accepting Orientation: : Oriented to Place, Oriented to  Time, Oriented to Situation, Oriented to Self Alcohol / Substance Use: Not Applicable Psych Involvement: No (comment)  Admission diagnosis:  Stroke [facility]) [I63.9] Cerebrovascular accident (CVA), unspecified mechanism ([place]) [I63.9] Patient Active Problem List   Diagnosis Date Noted   Stroke ([place]) 03/05/2022   Thrombopenia ([place]) 03/05/2022   Elevated  fasting blood sugar 01/28/2017   Hyperlipidemia 01/28/2017   Hypothyroidism 01/28/2017   PCP:  Sofie Hartigan, MD Pharmacy:   [place], [place] - [address] [place] [address] Phone: [phone] Fax: [phone]     Social Determinants of Health (SDOH) Interventions    Readmission Risk Interventions     No data to display

## 2023-11-26 ENCOUNTER — Other Ambulatory Visit: Payer: Self-pay | Admitting: Urology

## 2023-12-06 ENCOUNTER — Ambulatory Visit: Payer: Medicare HMO | Admitting: Obstetrics

## 2024-01-19 ENCOUNTER — Other Ambulatory Visit: Payer: Self-pay | Admitting: Urology

## 2024-02-03 NOTE — Progress Notes (Addendum)
 Date of COVID positive in last 90 days: No  PCP - Dorsie Gaunt, MD Cardiologist - Shellee Devonshire, MD  Chest x-ray - N/A EKG - 01-19-24 [facility] (copy under media) Stress Test - N/A ECHO - 01-19-24 [facility] Cardiac Cath - N/A Pacemaker/ICD device last checked:N/A Spinal Cord Stimulator:N/A  Bowel Prep -  Yes, Miralax .  Patient has instructions  Sleep Study - N/A CPAP -   Fasting Blood Sugar - N/A Checks Blood Sugar _____ times a day  Last dose of GLP1 agonist-  N/A GLP1 instructions:  Do not take after     Last dose of SGLT-2 inhibitors-  N/A SGLT-2 instructions:  Do not take after    Blood Thinner Instructions:  Plavix .   Last dose:  02-03-24 (knows to hold until after surgery)   Aspirin  Instructions:N/A Last Dose:  Activity level:  Can go up a flight of stairs and perform activities of daily living without stopping and without symptoms of chest pain or shortness of breath.  Able to exercise without symptoms  Anesthesia review:  Abnormal EKG, hx of stroke with residual balance issues and issues with peripheral vision.  Patient denies shortness of breath, fever, cough and chest pain at PAT appointment  Patient verbalized understanding of instructions that were given to them at the PAT appointment. Patient was also instructed that they will need to review over the PAT instructions again at home before surgery.

## 2024-02-03 NOTE — Patient Instructions (Addendum)
 SURGICAL WAITING ROOM VISITATION Patients having surgery or a procedure may have no more than 2 support people in the waiting area - these visitors may rotate.    Children under the age of 35 must have an adult with them who is not the patient.  If the patient needs to stay at the hospital during part of their recovery, the visitor guidelines for inpatient rooms apply. Pre-op nurse will coordinate an appropriate time for 1 support person to accompany patient in pre-op.  This support person may not rotate.    Please refer to the [facility] website for the visitor guidelines for Inpatients (after your surgery is over and you are in a regular room).       Your procedure is scheduled on: 02-09-24   Report to [facility] Main Entrance    Report to admitting at 5:15 AM   Call this number if you have problems the morning of surgery [phone]   Do not eat food or drink liquids :After Midnight.           If you have questions, please contact your surgeon's office.   FOLLOW BOWEL PREP AND ANY ADDITIONAL PRE OP INSTRUCTIONS YOU RECEIVED FROM YOUR SURGEON'S OFFICE!!!  Miralax  -  Mix contents of container with Gatorade or G Zero. For 119 g container, mix with 32 oz. For 238 g mix with 64 oz. Do NOT mix with red Gatorade or G Zero.    Oral Hygiene is also important to reduce your risk of infection.                                    Remember - BRUSH YOUR TEETH THE MORNING OF SURGERY WITH YOUR REGULAR TOOTHPASTE   Do NOT smoke after Midnight   Take these medicines the morning of surgery with A SIP OF WATER:    Atorvastatin    Allegra   Levothyroxine    Okay to use eyedrops, nasal spray   Tylenol  if needed  Stop all vitamins and herbal supplements 7 days before surgery  Bring CPAP mask and tubing day of surgery.                              You may not have any metal on your body including hair pins, jewelry, and body piercing             Do not wear make-up, lotions, powders,  perfumes, or deodorant  Do not wear nail polish including gel and S&S, artificial/acrylic nails, or any other type of covering on natural nails including finger and toenails. If you have artificial nails, gel coating, etc. that needs to be removed by a nail salon please have this removed prior to surgery or surgery may need to be canceled/ delayed if the surgeon/ anesthesia feels like they are unable to be safely monitored.   Do not shave  48 hours prior to surgery.    Do not bring valuables to the hospital. [facility] IS NOT RESPONSIBLE   FOR VALUABLES.   Contacts, dentures or bridgework may not be worn into surgery.   Bring small overnight bag day of surgery.   DO NOT BRING YOUR HOME MEDICATIONS TO THE HOSPITAL. PHARMACY WILL DISPENSE MEDICATIONS LISTED ON YOUR MEDICATION LIST TO YOU DURING YOUR ADMISSION IN THE HOSPITAL!   Special Instructions: Bring a copy of your healthcare power  of attorney and living will documents the day of surgery if you haven't scanned them before.              Please read over the following fact sheets you were given: IF YOU HAVE QUESTIONS ABOUT YOUR PRE-OP INSTRUCTIONS PLEASE CALL [phone] [facility]  If you received a COVID test during your pre-op visit  it is requested that you wear a mask when out in public, stay away from anyone that may not be feeling well and notify your surgeon if you develop symptoms. If you test positive for Covid or have been in contact with anyone that has tested positive in the last 10 days please notify you surgeon.  [facility] - Preparing for Surgery Before surgery, you can play an important role.  Because skin is not sterile, your skin needs to be as free of germs as possible.  You can reduce the number of germs on your skin by washing with CHG (chlorahexidine gluconate) soap before surgery.  CHG is an antiseptic cleaner which kills germs and bonds with the skin to continue killing germs even after washing. Please DO NOT use if you  have an allergy to CHG or antibacterial soaps.  If your skin becomes reddened/irritated stop using the CHG and inform your nurse when you arrive at Short Stay. Do not shave (including legs and underarms) for at least 48 hours prior to the first CHG shower.  You may shave your face/neck.  Please follow these instructions carefully:  1.  Shower with CHG Soap the night before surgery and the  morning of surgery.  2.  If you choose to wash your hair, wash your hair first as usual with your normal  shampoo.  3.  After you shampoo, rinse your hair and body thoroughly to remove the shampoo.                             4.  Use CHG as you would any other liquid soap.  You can apply chg directly to the skin and wash.  Gently with a scrungie or clean washcloth.  5.  Apply the CHG Soap to your body ONLY FROM THE NECK DOWN.   Do   not use on face/ open                           Wound or open sores. Avoid contact with eyes, ears mouth and   genitals (private parts).                       Wash face,  Genitals (private parts) with your normal soap.             6.  Wash thoroughly, paying special attention to the area where your    surgery  will be performed.  7.  Thoroughly rinse your body with warm water from the neck down.  8.  DO NOT shower/wash with your normal soap after using and rinsing off the CHG Soap.                9.  Pat yourself dry with a clean towel.            10.  Wear clean pajamas.            11.  Place clean sheets on your bed the night of your first shower and do not  sleep with pets. Day of Surgery : Do not apply any lotions/deodorants the morning of surgery.  Please wear clean clothes to the hospital/surgery center.  FAILURE TO FOLLOW THESE INSTRUCTIONS MAY RESULT IN THE CANCELLATION OF YOUR SURGERY  PATIENT SIGNATURE_________________________________  NURSE SIGNATURE__________________________________  ________________________________________________________________________   WHAT  IS A BLOOD TRANSFUSION? Blood Transfusion Information  A transfusion is the replacement of blood or some of its parts. Blood is made up of multiple cells which provide different functions. Red blood cells carry oxygen and are used for blood loss replacement. White blood cells fight against infection. Platelets control bleeding. Plasma helps clot blood. Other blood products are available for specialized needs, such as hemophilia or other clotting disorders. BEFORE THE TRANSFUSION  Who gives blood for transfusions?  Healthy volunteers who are fully evaluated to make sure their blood is safe. This is blood bank blood. Transfusion therapy is the safest it has ever been in the practice of medicine. Before blood is taken from a donor, a complete history is taken to make sure that person has no history of diseases nor engages in risky social behavior (examples are intravenous drug use or sexual activity with multiple partners). The donor's travel history is screened to minimize risk of transmitting infections, such as malaria. The donated blood is tested for signs of infectious diseases, such as HIV and hepatitis. The blood is then tested to be sure it is compatible with you in order to minimize the chance of a transfusion reaction. If you or a relative donates blood, this is often done in anticipation of surgery and is not appropriate for emergency situations. It takes many days to process the donated blood. RISKS AND COMPLICATIONS Although transfusion therapy is very safe and saves many lives, the main dangers of transfusion include:  Getting an infectious disease. Developing a transfusion reaction. This is an allergic reaction to something in the blood you were given. Every precaution is taken to prevent this. The decision to have a blood transfusion has been considered carefully by your caregiver before blood is given. Blood is not given unless the benefits outweigh the risks. AFTER THE  TRANSFUSION Right after receiving a blood transfusion, you will usually feel much better and more energetic. This is especially true if your red blood cells have gotten low (anemic). The transfusion raises the level of the red blood cells which carry oxygen, and this usually causes an energy increase. The nurse administering the transfusion will monitor you carefully for complications. HOME CARE INSTRUCTIONS  No special instructions are needed after a transfusion. You may find your energy is better. Speak with your caregiver about any limitations on activity for underlying diseases you may have. SEEK MEDICAL CARE IF:  Your condition is not improving after your transfusion. You develop redness or irritation at the intravenous (IV) site. SEEK IMMEDIATE MEDICAL CARE IF:  Any of the following symptoms occur over the next 12 hours: Shaking chills. You have a temperature by mouth above 102 F (38.9 C), not controlled by medicine. Chest, back, or muscle pain. People around you feel you are not acting correctly or are confused. Shortness of breath or difficulty breathing. Dizziness and fainting. You get a rash or develop hives. You have a decrease in urine output. Your urine turns a dark color or changes to pink, red, or brown. Any of the following symptoms occur over the next 10 days: You have a temperature by mouth above 102 F (38.9 C), not controlled by medicine. Shortness of breath. Weakness  after normal activity. The white part of the eye turns yellow (jaundice). You have a decrease in the amount of urine or are urinating less often. Your urine turns a dark color or changes to pink, red, or brown. Document Released: 07/31/2000 Document Revised: 10/26/2011 Document Reviewed: 03/19/2008 [facility] Patient Information 2014 [place], [place].  _______________________________________________________________________

## 2024-02-04 ENCOUNTER — Other Ambulatory Visit: Payer: Self-pay

## 2024-02-04 ENCOUNTER — Encounter (HOSPITAL_COMMUNITY): Payer: Self-pay

## 2024-02-04 ENCOUNTER — Encounter (HOSPITAL_COMMUNITY)
Admission: RE | Admit: 2024-02-04 | Discharge: 2024-02-04 | Disposition: A | Discharge status: Home / Self Care | Payer: Self-pay | Source: Ambulatory Visit | Attending: Urology | Admitting: Urology

## 2024-02-04 DIAGNOSIS — E039 Hypothyroidism, unspecified: Secondary | ICD-10-CM | POA: Insufficient documentation

## 2024-02-04 DIAGNOSIS — N393 Stress incontinence (female) (male): Secondary | ICD-10-CM | POA: Insufficient documentation

## 2024-02-04 DIAGNOSIS — N8189 Other female genital prolapse: Secondary | ICD-10-CM | POA: Insufficient documentation

## 2024-02-04 DIAGNOSIS — N8111 Cystocele, midline: Secondary | ICD-10-CM | POA: Insufficient documentation

## 2024-02-04 DIAGNOSIS — Z01812 Encounter for preprocedural laboratory examination: Secondary | ICD-10-CM | POA: Diagnosis present

## 2024-02-04 DIAGNOSIS — Z8673 Personal history of transient ischemic attack (TIA), and cerebral infarction without residual deficits: Secondary | ICD-10-CM | POA: Insufficient documentation

## 2024-02-04 DIAGNOSIS — Z87891 Personal history of nicotine dependence: Secondary | ICD-10-CM | POA: Insufficient documentation

## 2024-02-04 HISTORY — DX: Cerebral infarction, unspecified: I63.9

## 2024-02-04 LAB — BASIC METABOLIC PANEL WITH GFR
Anion gap: 7 (ref 5–15)
BUN: 15 mg/dL (ref 8–23)
CO2: 27 mmol/L (ref 22–32)
Calcium: 9.3 mg/dL (ref 8.9–10.3)
Chloride: 106 mmol/L (ref 98–111)
Creatinine, Ser: 0.97 mg/dL (ref 0.44–1.00)
GFR, Estimated: 59 mL/min — ABNORMAL LOW (ref >60)
Glucose, Bld: 89 mg/dL (ref 70–99)
Potassium: 4.4 mmol/L (ref 3.5–5.1)
Sodium: 140 mmol/L (ref 135–145)

## 2024-02-04 LAB — CBC
HCT: 37.5 % (ref 36.0–46.0)
Hemoglobin: 11.7 g/dL — ABNORMAL LOW (ref 12.0–15.0)
MCH: 31.3 pg (ref 26.0–34.0)
MCHC: 31.2 g/dL (ref 30.0–36.0)
MCV: 100.3 fL — ABNORMAL HIGH (ref 80.0–100.0)
Platelets: 137 10*3/uL — ABNORMAL LOW (ref 150–400)
RBC: 3.74 MIL/uL — ABNORMAL LOW (ref 3.87–5.11)
RDW: 12.8 % (ref 11.5–15.5)
WBC: 5.9 10*3/uL (ref 4.0–10.5)
nRBC: 0 % (ref 0.0–0.2)

## 2024-02-04 LAB — TYPE AND SCREEN

## 2024-02-06 LAB — URINE CULTURE: Culture: 20000 — AB

## 2024-02-07 NOTE — Progress Notes (Signed)
 Urine culture results sent to Dr. Cam for review.

## 2024-02-07 NOTE — Anesthesia Preprocedure Evaluation (Signed)
 Anesthesia Evaluation  Patient identified by MRN, date of birth, ID band Patient awake    Reviewed: Allergy & Precautions, H&P , NPO status , Patient's Chart, lab work & pertinent test results  Airway Mallampati: III  TM Distance: >3 FB Neck ROM: Full    Dental  (+) Dental Advisory Given   Pulmonary former smoker   Pulmonary exam normal breath sounds clear to auscultation       Cardiovascular Exercise Tolerance: Good + Valvular Problems/Murmurs AI  Rhythm:Regular Rate:Normal + Systolic murmurs Echo 01/19/2024  1. The left ventricle is normal in size with mildly to moderately increased wall thickness.    2. The left ventricular systolic function is normal, LVEF is visually estimated at 60-65%.    3. The aortic valve is trileaflet with mildly thickened leaflets with normal excursion.    4. There is mild aortic regurgitation.    5. The right ventricle is normal in size, with normal systolic function.    6. The right atrium is mildly dilated in size.    7. The aorta at the sinuses of Valsalva, ascending aorta and descending aorta is mildly dilated.    8. The descending aorta may be dilated, potentially compressing the left atrium. CT angiography is recommended for further evaluation.    9. Mild atheroma noted in the descending aorta.    Echo 03/06/2022 1. Negative bubble study.   2. Left ventricular ejection fraction, by estimation, is 65 to 70%. The left ventricle has normal function. The left ventricle has no regional wall motion abnormalities. There is mild left ventricular hypertrophy. Left ventricular diastolic parameters are consistent with Grade I diastolic dysfunction (impaired relaxation).   3. Right ventricular systolic function is normal. The right ventricular size is normal.   4. The mitral valve is normal in structure. Trivial mitral valve regurgitation.   5. The aortic valve is normal in structure. Aortic valve regurgitation  is not visualized.     Neuro/Psych Bell's palsy  negative psych ROS   GI/Hepatic negative GI ROS, Neg liver ROS,,,  Endo/Other  Hypothyroidism    Renal/GU negative Renal ROS     Musculoskeletal  (+) Arthritis ,    Abdominal   Peds  Hematology negative hematology ROS (+)   Anesthesia Other Findings   Reproductive/Obstetrics negative OB ROS                              Anesthesia Physical Anesthesia Plan  ASA: 3  Anesthesia Plan: General   Post-op Pain Management: Tylenol  PO (pre-op)*   Induction: Intravenous  PONV Risk Score and Plan: 4 or greater and Ondansetron, Dexamethasone  and Treatment may vary due to age or medical condition  Airway Management Planned: Oral ETT  Additional Equipment:   Intra-op Plan:   Post-operative Plan: Extubation in OR  Informed Consent: I have reviewed the patients History and Physical, chart, labs and discussed the procedure including the risks, benefits and alternatives for the proposed anesthesia with the patient or authorized representative who has indicated his/her understanding and acceptance.     Dental advisory given  Plan Discussed with: CRNA  Anesthesia Plan Comments: (2 x PIV  See PAT note 02/04/2024)         Anesthesia Quick Evaluation

## 2024-02-07 NOTE — Progress Notes (Signed)
 Anesthesia Chart Review   Case: [id number] Date/Time: 02/09/24 0715   Procedure: HYSTERECTOMY, SUPRACERVICAL, ROBOT-ASSISTED, LAPAROSCOPIC, WITH BILATERAL SALPINGECTOMY (Bilateral) - ROBOTIC SUPRACERVICAL HYSTERECTOMY, BILATERAL SALPINGO-OOPHORECTOMY, SACROCOLPOPEXY, CYSTOSCOPY WITH MID-URETHRAL SLING   Anesthesia type: General   Diagnosis:      Cystocele, midline [N81.11]     Female stress incontinence [N39.3]   Pre-op diagnosis: PELVIC FLOOR PROLAPSE, STRESS URINIARY INCONTINENCE   Location: [facility] ROOM 03 / [facility]   Surgeons: Cam Morene ORN, MD       DISCUSSION:77 y.o. 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 Morene Cam.   Pt last seen by cardiology 01/19/2024. Per OV note, Patient is able to accomplish more than 4 METS with no limitations. 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 She denied history of congestive heart failure, diabetes mellitus, or elevated creatinine. She denied any active cardiac conditions including unstable angina, decompensated congestive heart failure, significant arrhythmias, or severe valvular disease. For an invasive procedure like supracervical hysterectomy with robotic assistance, laparoscopic approach, and bilateral salpingectomy. As well as bladder and rectal prolapse repair it is reasonable to obtain an echocardiogram to assess heart function and structure before comfortably approving her for her surgery. Patient agrees.  Echo 01/19/2024 with EF 60-65%, mild aortic regurgitation.   Pt last dose of Plavix  02/03/2024.  VS: BP (!) 142/78   Pulse 63   Temp 36.5 C (Oral)   Resp 16   Ht 5' 7 (1.702 m)   Wt 57.2 kg   SpO2 100%   BMI 19.77 kg/m   PROVIDERS: Jeffie Cheryl BRAVO, MD is PCP    LABS: Labs reviewed: Acceptable for surgery. (all labs ordered are listed, but only abnormal  results are displayed)  Labs Reviewed  URINE CULTURE - Abnormal; Notable for the following components:      Result Value   Culture   (*)    Value: 20,000 COLONIES/mL ESCHERICHIA COLI Confirmed Extended Spectrum Beta-Lactamase Producer (ESBL).  In bloodstream infections from ESBL organisms, carbapenems are preferred over piperacillin/tazobactam. They are shown to have a lower risk of mortality.    Organism ID, Bacteria ESCHERICHIA COLI (*)    All other components within normal limits  CBC - Abnormal; Notable for the following components:   RBC 3.74 (*)    Hemoglobin 11.7 (*)    MCV 100.3 (*)    Platelets 137 (*)    All other components within normal limits  BASIC METABOLIC PANEL WITH GFR - Abnormal; Notable for the following components:   GFR, Estimated 59 (*)    All other components within normal limits  TYPE AND SCREEN     IMAGES:   EKG:   CV: Echo 01/19/2024 Summary   1. The left ventricle is normal in size with mildly to moderately increased  wall thickness.    2. The left ventricular systolic function is normal, LVEF is visually  estimated at 60-65%.    3. The aortic valve is trileaflet with mildly thickened leaflets with normal  excursion.   4. There is mild aortic regurgitation.    5. The right ventricle is normal in size, with normal systolic function.    6. The right atrium is mildly dilated in size.    7. The aorta at the sinuses of Valsalva, ascending aorta and descending  aorta is mildly dilated.    8. The descending aorta may be dilated, potentially compressing  the left  atrium. CT angiography is recommended for further evaluation.    9. Mild atheroma noted in the descending aorta.   Echo 03/06/2022 1. Negative bubble study.   2. Left ventricular ejection fraction, by estimation, is 65 to 70%. The  left ventricle has normal function. The left ventricle has no regional  wall motion abnormalities. There is mild left ventricular hypertrophy.  Left  ventricular diastolic parameters  are consistent with Grade I diastolic dysfunction (impaired relaxation).   3. Right ventricular systolic function is normal. The right ventricular  size is normal.   4. The mitral valve is normal in structure. Trivial mitral valve  regurgitation.   5. The aortic valve is normal in structure. Aortic valve regurgitation is  not visualized.  Past Medical History:  Diagnosis Date   Arthritis    hands   Bell's palsy    Elevated fasting blood sugar 01/28/2017   Glaucoma    Hyperlipidemia, unspecified 01/28/2017   Hypothyroidism    Hypothyroidism, unspecified 01/28/2017   Stroke [facility])     Past Surgical History:  Procedure Laterality Date   CATARACT EXTRACTION  2017   CATARACT EXTRACTION W/PHACO Left 05/05/2017   Procedure: CATARACT EXTRACTION PHACO AND INTRAOCULAR LENS PLACEMENT (IOC) LEFT COMPLICATED;  Surgeon: Mittie Gaskin, MD;  Location: [facility];  Service: Ophthalmology;  Laterality: Left;   EYE SURGERY     lazer eye   1990   THYROIDECTOMY     TRABECULECTOMY Left 05/05/2017   Procedure: TRABECULECTOMY WITH [facility] AND EXPRESS SHUNT;  Surgeon: Mittie Gaskin, MD;  Location: [facility];  Service: Ophthalmology;  Laterality: Left;    MEDICATIONS:  acetaminophen  (TYLENOL ) 500 MG tablet   atorvastatin  (LIPITOR) 80 MG tablet   brimonidine  (ALPHAGAN ) 0.2 % ophthalmic solution   clopidogrel  (PLAVIX ) 75 MG tablet   cyanocobalamin (VITAMIN B12) 1000 MCG tablet   dorzolamide -timolol  (COSOPT ) 22.3-6.8 MG/ML ophthalmic solution   fexofenadine (ALLEGRA) 180 MG tablet   fluticasone  (FLONASE ) 50 MCG/ACT nasal spray   ibuprofen (ADVIL,MOTRIN) 200 MG tablet   latanoprost  (XALATAN ) 0.005 % ophthalmic solution   levothyroxine  (SYNTHROID , LEVOTHROID) 112 MCG tablet   Multiple Vitamin (MULTI-VITAMINS) TABS   OVER THE COUNTER MEDICATION   St Johns Wort 300 MG CAPS   vitamin E 400 UNIT capsule   No current facility-administered  medications for this encounter.   Harlene Hoots Ward, PA-C [facility] [phone]

## 2024-02-08 NOTE — H&P (Signed)
 CC: Pelvic organ prolapse, gross hematuria  HPI:  10/21/2023  78 year old female with a history of documented grade 3 cystocele and grade 2 rectocele on physical examination by her gynecologist as well as vaginal atrophy. She was noted to have microscopic hematuria at that time. She does note a couple of episodes of gross hematuria and blood on the toilet tissue. She denies any UTIs, dysuria, flank pain or discomfort. She does have some urgency with associated urge urinary incontinence. She has been referred to a urogynecologist but has not seen one yet. She is interested in procedural management for her prolapse. She does take Plavix  for history of stroke. Does not appear to have significant sequela from the stroke.   She states that over the last several years her prolapse has gotten quite severe. She is now having significant voiding symptoms as well as constipation and difficulty with bowel movements. She does not have a history of recurrent urinary tract infections. She does have more discomfort and pain.   The patient still has her uterus. She had her tubes tied many years ago.   She is fully functional, but does have some mild right sided residual weakness from her stroke. She does take Plavix .   UDS, 4/25: The patient has a normal bladder capacity with normal bladder sensation. There is no evidence of overactive detrusor function or stress associated incontinence. She seems to be emptying her bladder relatively well.   Interval: Today the patient is here for follow-up. She Edronax prior to her appointment today. She is otherwise been doing well. She is ready to have her surgery, it is already been scheduled.     ALLERGIES: No Allergies    MEDICATIONS: Levothyroxine  Sodium  Atorvastatin  Calcium   Clopidogrel   Multivitamin  Vitamin B-12  Vitamin C  Vitamin E  Zinc     GU PSH: Complex cystometrogram, w/ void pressure and urethral pressure profile studies, any technique -  12/23/2023 Complex Uroflow - 12/23/2023 Emg surf Electrd - 12/23/2023 Inject For cystogram - 12/23/2023 Intrabd voidng Press - 12/23/2023 Locm 300-399Mg /Ml Iodine,1Ml - 11/12/2023     NON-GU PSH: Bilateral Tubal Ligation Thyroidectomy Visit Complexity (formerly GPC1X) - 11/18/2023     GU PMH: Urge incontinence - 12/23/2023, - 11/18/2023, - 10/21/2023 Cystocele, midline - 11/18/2023, - 10/21/2023 Gross hematuria - 11/12/2023, - 10/21/2023    NON-GU PMH: Glaucoma Hyperthyroidism Stroke/TIA    FAMILY HISTORY: 2 daughters - Other 1 son - Other Myocardial Infarction - Father   SOCIAL HISTORY: Marital Status: Widowed Preferred Language: English Current Smoking Status: Patient does not smoke anymore. Has not smoked since 10/16/2015.   Tobacco Use Assessment Completed: Used Tobacco in last 30 days? Drinks 2 caffeinated drinks per day.    REVIEW OF SYSTEMS:    GU Review Female:   Patient denies frequent urination, hard to postpone urination, burning /pain with urination, get up at night to urinate, leakage of urine, stream starts and stops, trouble starting your stream, have to strain to urinate, and being pregnant.  Gastrointestinal (Upper):   Patient denies nausea, vomiting, and indigestion/ heartburn.  Gastrointestinal (Lower):   Patient denies diarrhea and constipation.  Constitutional:   Patient denies fever, night sweats, weight loss, and fatigue.  Skin:   Patient denies skin rash/ lesion and itching.  Eyes:   Patient denies blurred vision and double vision.  Ears/ Nose/ Throat:   Patient denies sore throat and sinus problems.  Hematologic/Lymphatic:   Patient denies swollen glands and easy bruising.  Cardiovascular:  Patient denies leg swelling and chest pains.  Respiratory:   Patient denies cough and shortness of breath.  Endocrine:   Patient denies excessive thirst.  Musculoskeletal:   Patient denies back pain and joint pain.  Neurological:   Patient denies headaches and dizziness.   Psychologic:   Patient denies depression and anxiety.   VITAL SIGNS: None   GU PHYSICAL EXAMINATION:    Urethral Meatus: Normal size. Normal position. No discharge.  Urethra: No tenderness, no mass, no scarring. No hypermobility. No leakage.  Bladder: Normal to palpation, no tenderness, no mass, normal size.  Vagina: Mild vaginal atrophy. First-degree rectocele. Second-degree cystocele. No stenosis. No enterocele.    MULTI-SYSTEM PHYSICAL EXAMINATION:    Respiratory: Normal breath sounds. No labored breathing, no use of accessory muscles.   Cardiovascular: Regular rate and rhythm. No murmur, no gallop. Normal temperature, normal extremity pulses, no swelling, no varicosities.      Complexity of Data:  Source Of History:  Patient  Records Review:   Previous Doctor Records, Previous Patient Records, POC Tool  Urine Test Review:   Urinalysis  Urodynamics Review:   Review Bladder Scan, Review Urodynamics Tests   PROCEDURES:         In and Out Cath for Specimen/ Medicare - [phone]  A 14 French clear straight catheter was inserted into the bladder using sterile technique. A urinalysis was sent to the lab. A urine culture was sent to the lab. 150 cc of urine was obtained.         Pelvic Examination - [id number] Pelvic examination is performed in the presence of a chaperone. Rosina Kerns         Visit Complexity - [phone]          Urinalysis w/Scope - 81001 Dipstick Dipstick Cont'd Micro  Color: Yellow Bilirubin: Neg WBC/hpf: >60/hpf  Appearance: Cloudy Ketones: Neg RBC/hpf: 0 - 2/hpf  Specific Gravity: 1.025 Blood: 1+ Bacteria: Many (>50/hpf)  pH: 6.0 Protein: Trace Cystals: NS (Not Seen)  Glucose: Neg Urobilinogen: 0.2 Casts: NS (Not Seen)    Nitrites: Positive Trichomonas: Not Present    Leukocyte Esterase: 2+ Mucous: Present      Epithelial Cells: NS (Not Seen)      Yeast: NS (Not Seen)      Sperm: Not Present    Notes:      ASSESSMENT:      ICD-10 Details  1 GU:    Cystocele, midline - N81.11    PLAN:           Orders Labs Urine Culture          Document Letter(s):  Created for Patient: Clinical Summary         Notes:   I went over robotic-assisted laparoscopic sacral colpopexy with the patient detail. I explained to the patient the rationale for the surgery. I also went over the placement of the laparoscopic ports. I detailed to her the surgery as well as the postoperative recovery time. I explained to the patient that she could expect to be in the hospital at least one or 2 nights. She will require 4 weeks of no heavy lifting, 6 weeks of no bending or twisting. She will not be able to use her vagina for 6 weeks. I discussed complications of the operation including injury to bowel, ureters, bladder. We also discussed the risk of failure as well as the complications of mesh. I explained to them the difference between transvaginal mesh and the mesh used for sacral  colpopexy. I reassured them that there has not been an FDA warnings in regards to sacral colpopexy mesh. We will plan to get this prior to her surgery. I spent 45 minutes with the patient going over that ins and outs of the surgery and answering all her questions.   In addition, the patient will need a supracervical hysterectomy. We discussed the implications of that as well.   Am sending the patient's urine to be cultured today, we will plan to treat her 5 days prior to her surgery if she has bacteria in her urine today. Reschedule surgery as approximate 1 month today.

## 2024-02-09 ENCOUNTER — Encounter (HOSPITAL_COMMUNITY): Payer: Self-pay | Admitting: Urology

## 2024-02-09 ENCOUNTER — Ambulatory Visit (HOSPITAL_COMMUNITY): Admitting: Anesthesiology

## 2024-02-09 ENCOUNTER — Other Ambulatory Visit: Payer: Self-pay

## 2024-02-09 ENCOUNTER — Observation Stay (HOSPITAL_COMMUNITY)
Admission: RE | Admit: 2024-02-09 | Discharge: 2024-02-10 | Disposition: A | Discharge status: Home / Self Care | Payer: Self-pay | Attending: Urology | Admitting: Urology

## 2024-02-09 ENCOUNTER — Encounter (HOSPITAL_COMMUNITY)
Admission: RE | Disposition: A | Discharge status: Home / Self Care | Payer: Self-pay | Source: Home / Self Care | Attending: Urology

## 2024-02-09 ENCOUNTER — Ambulatory Visit (HOSPITAL_COMMUNITY): Admitting: Physician Assistant

## 2024-02-09 DIAGNOSIS — Z87891 Personal history of nicotine dependence: Secondary | ICD-10-CM | POA: Diagnosis not present

## 2024-02-09 DIAGNOSIS — N819 Female genital prolapse, unspecified: Principal | ICD-10-CM | POA: Insufficient documentation

## 2024-02-09 DIAGNOSIS — N393 Stress incontinence (female) (male): Secondary | ICD-10-CM | POA: Insufficient documentation

## 2024-02-09 DIAGNOSIS — E039 Hypothyroidism, unspecified: Secondary | ICD-10-CM

## 2024-02-09 DIAGNOSIS — N8189 Other female genital prolapse: Secondary | ICD-10-CM | POA: Diagnosis not present

## 2024-02-09 DIAGNOSIS — Z8673 Personal history of transient ischemic attack (TIA), and cerebral infarction without residual deficits: Secondary | ICD-10-CM | POA: Diagnosis not present

## 2024-02-09 DIAGNOSIS — N8111 Cystocele, midline: Secondary | ICD-10-CM | POA: Diagnosis not present

## 2024-02-09 DIAGNOSIS — Z79899 Other long term (current) drug therapy: Secondary | ICD-10-CM | POA: Insufficient documentation

## 2024-02-09 DIAGNOSIS — N814 Uterovaginal prolapse, unspecified: Principal | ICD-10-CM | POA: Diagnosis present

## 2024-02-09 DIAGNOSIS — E785 Hyperlipidemia, unspecified: Secondary | ICD-10-CM | POA: Diagnosis not present

## 2024-02-09 DIAGNOSIS — I679 Cerebrovascular disease, unspecified: Secondary | ICD-10-CM | POA: Diagnosis not present

## 2024-02-09 HISTORY — PX: ROBOTIC ASSISTED SUPRACERVICAL HYSTERECTOMY WITH BILATERAL SALPINGO OOPHERECTOMY: SHX6084

## 2024-02-09 LAB — TYPE AND SCREEN
ABO/RH(D): O POS
Antibody Screen: NEGATIVE

## 2024-02-09 LAB — HEMOGLOBIN AND HEMATOCRIT, BLOOD
HCT: 34.8 % — ABNORMAL LOW (ref 36.0–46.0)
Hemoglobin: 10.9 g/dL — ABNORMAL LOW (ref 12.0–15.0)

## 2024-02-09 LAB — ABO/RH: ABO/RH(D): O POS

## 2024-02-09 SURGERY — HYSTERECTOMY, SUPRACERVICAL, ROBOT-ASSISTED, LAPAROSCOPIC, WITH BILATERAL SALPINGECTOMY
Anesthesia: General | Site: Vagina | Laterality: Bilateral

## 2024-02-09 MED ORDER — HYDROMORPHONE HCL 1 MG/ML IJ SOLN
0.2500 mg | INTRAMUSCULAR | Status: DC | PRN
  Administered 2024-02-09: 0.25 mg via INTRAVENOUS
  Administered 2024-02-09: 0.5 mg via INTRAVENOUS

## 2024-02-09 MED ORDER — FENTANYL CITRATE (PF) 100 MCG/2ML IJ SOLN
INTRAMUSCULAR | Status: DC | PRN
  Administered 2024-02-09 (×4): 50 ug via INTRAVENOUS

## 2024-02-09 MED ORDER — DOCUSATE SODIUM 100 MG PO CAPS
100.0000 mg | ORAL_CAPSULE | Freq: Two times a day (BID) | ORAL | Status: DC
  Administered 2024-02-09 – 2024-02-10 (×3): 100 mg via ORAL
  Filled 2024-02-09 (×3): qty 1

## 2024-02-09 MED ORDER — GLYCOPYRROLATE 0.2 MG/ML IJ SOLN
INTRAMUSCULAR | Status: DC | PRN
  Administered 2024-02-09 (×2): .1 mg via INTRAVENOUS

## 2024-02-09 MED ORDER — PROPOFOL 10 MG/ML IV BOLUS
INTRAVENOUS | Status: DC | PRN
  Administered 2024-02-09: 90 mg via INTRAVENOUS

## 2024-02-09 MED ORDER — TRAMADOL HCL 50 MG PO TABS
50.0000 mg | ORAL_TABLET | Freq: Four times a day (QID) | ORAL | Status: DC | PRN
  Administered 2024-02-09 – 2024-02-10 (×2): 50 mg via ORAL
  Filled 2024-02-09 (×2): qty 1

## 2024-02-09 MED ORDER — CEFAZOLIN SODIUM-DEXTROSE 2-4 GM/100ML-% IV SOLN
2.0000 g | INTRAVENOUS | Status: AC
  Administered 2024-02-09: 2 g via INTRAVENOUS
  Filled 2024-02-09: qty 100

## 2024-02-09 MED ORDER — LACTATED RINGERS IV SOLN: INTRAVENOUS | Status: DC

## 2024-02-09 MED ORDER — PHENYLEPHRINE HCL (PRESSORS) 10 MG/ML IV SOLN
INTRAVENOUS | Status: AC
  Filled 2024-02-09: qty 1

## 2024-02-09 MED ORDER — ALBUMIN HUMAN 5 % IV SOLN
INTRAVENOUS | Status: DC | PRN
Start: 2024-02-09 — End: 2024-02-09

## 2024-02-09 MED ORDER — SULFAMETHOXAZOLE-TRIMETHOPRIM 800-160 MG PO TABS: 1.0000 | ORAL_TABLET | Freq: Two times a day (BID) | ORAL | 0 refills | Status: AC

## 2024-02-09 MED ORDER — SUGAMMADEX SODIUM 200 MG/2ML IV SOLN
INTRAVENOUS | Status: DC | PRN
  Administered 2024-02-09: 200 mg via INTRAVENOUS

## 2024-02-09 MED ORDER — PROPOFOL 10 MG/ML IV BOLUS
INTRAVENOUS | Status: AC
  Filled 2024-02-09: qty 20

## 2024-02-09 MED ORDER — ROCURONIUM BROMIDE 10 MG/ML (PF) SYRINGE
PREFILLED_SYRINGE | INTRAVENOUS | Status: DC | PRN
Start: 2024-02-09 — End: 2024-02-09
  Administered 2024-02-09: 60 mg via INTRAVENOUS

## 2024-02-09 MED ORDER — EPHEDRINE 5 MG/ML INJ
INTRAVENOUS | Status: AC
  Filled 2024-02-09: qty 5

## 2024-02-09 MED ORDER — LIDOCAINE HCL (PF) 2 % IJ SOLN
INTRAMUSCULAR | Status: DC | PRN
  Administered 2024-02-09: 50 mg via INTRADERMAL

## 2024-02-09 MED ORDER — ACETAMINOPHEN 10 MG/ML IV SOLN
1000.0000 mg | Freq: Four times a day (QID) | INTRAVENOUS | Status: DC
  Administered 2024-02-09 (×3): 1000 mg via INTRAVENOUS
  Filled 2024-02-09 (×4): qty 100

## 2024-02-09 MED ORDER — KETOROLAC TROMETHAMINE 15 MG/ML IJ SOLN
15.0000 mg | Freq: Four times a day (QID) | INTRAMUSCULAR | Status: DC
  Administered 2024-02-09 – 2024-02-10 (×4): 15 mg via INTRAVENOUS
  Filled 2024-02-09 (×4): qty 1

## 2024-02-09 MED ORDER — DEXMEDETOMIDINE HCL IN NACL 80 MCG/20ML IV SOLN
INTRAVENOUS | Status: AC
  Filled 2024-02-09: qty 20

## 2024-02-09 MED ORDER — CLINDAMYCIN PHOSPHATE 2 % VA CREA
TOPICAL_CREAM | VAGINAL | Status: AC
  Filled 2024-02-09: qty 40

## 2024-02-09 MED ORDER — ATORVASTATIN CALCIUM 20 MG PO TABS
80.0000 mg | ORAL_TABLET | Freq: Every day | ORAL | Status: DC
  Administered 2024-02-10: 80 mg via ORAL
  Filled 2024-02-09: qty 4

## 2024-02-09 MED ORDER — LIDOCAINE HCL (PF) 2 % IJ SOLN
INTRAMUSCULAR | Status: AC
  Filled 2024-02-09: qty 5

## 2024-02-09 MED ORDER — ROCURONIUM BROMIDE 10 MG/ML (PF) SYRINGE
PREFILLED_SYRINGE | INTRAVENOUS | Status: AC
  Filled 2024-02-09: qty 10

## 2024-02-09 MED ORDER — NAPHAZOLINE-GLYCERIN 0.012-0.25 % OP SOLN
1.0000 [drp] | Freq: Four times a day (QID) | OPHTHALMIC | Status: DC | PRN
  Administered 2024-02-09: 2 [drp] via OPHTHALMIC
  Filled 2024-02-09: qty 15

## 2024-02-09 MED ORDER — DEXMEDETOMIDINE HCL IN NACL 200 MCG/50ML IV SOLN
INTRAVENOUS | Status: DC | PRN
Start: 2024-02-09 — End: 2024-02-09
  Administered 2024-02-09: 8 ug via INTRAVENOUS

## 2024-02-09 MED ORDER — ALBUMIN HUMAN 5 % IV SOLN
INTRAVENOUS | Status: AC
  Filled 2024-02-09: qty 250

## 2024-02-09 MED ORDER — BRIMONIDINE TARTRATE 0.2 % OP SOLN
1.0000 [drp] | Freq: Two times a day (BID) | OPHTHALMIC | Status: DC
  Administered 2024-02-09 – 2024-02-10 (×2): 1 [drp] via OPHTHALMIC
  Filled 2024-02-09: qty 5

## 2024-02-09 MED ORDER — PHENYLEPHRINE 80 MCG/ML (10ML) SYRINGE FOR IV PUSH (FOR BLOOD PRESSURE SUPPORT)
PREFILLED_SYRINGE | INTRAVENOUS | Status: AC
  Filled 2024-02-09: qty 10

## 2024-02-09 MED ORDER — DROPERIDOL 2.5 MG/ML IJ SOLN: 0.6250 mg | Freq: Once | INTRAMUSCULAR | Status: DC | PRN

## 2024-02-09 MED ORDER — BUPIVACAINE-EPINEPHRINE (PF) 0.25% -1:200000 IJ SOLN
INTRAMUSCULAR | Status: AC
  Filled 2024-02-09: qty 30

## 2024-02-09 MED ORDER — DIPHENHYDRAMINE HCL 50 MG/ML IJ SOLN: 12.5000 mg | Freq: Four times a day (QID) | INTRAMUSCULAR | Status: DC | PRN

## 2024-02-09 MED ORDER — CHLORHEXIDINE GLUCONATE 0.12 % MT SOLN
15.0000 mL | Freq: Once | OROMUCOSAL | Status: AC
  Administered 2024-02-09: 15 mL via OROMUCOSAL

## 2024-02-09 MED ORDER — ESTRADIOL 0.1 MG/GM VA CREA
TOPICAL_CREAM | VAGINAL | Status: AC
  Filled 2024-02-09: qty 42.5

## 2024-02-09 MED ORDER — FENTANYL CITRATE (PF) 100 MCG/2ML IJ SOLN
INTRAMUSCULAR | Status: AC
Start: 2024-02-09 — End: 2024-02-09
  Filled 2024-02-09: qty 2

## 2024-02-09 MED ORDER — LEVOTHYROXINE SODIUM 112 MCG PO TABS
112.0000 ug | ORAL_TABLET | Freq: Every day | ORAL | Status: DC
  Administered 2024-02-10: 112 ug via ORAL
  Filled 2024-02-09: qty 1

## 2024-02-09 MED ORDER — DIPHENHYDRAMINE HCL 12.5 MG/5ML PO ELIX: 12.5000 mg | ORAL_SOLUTION | Freq: Four times a day (QID) | ORAL | Status: DC | PRN

## 2024-02-09 MED ORDER — DEXAMETHASONE SODIUM PHOSPHATE 10 MG/ML IJ SOLN
INTRAMUSCULAR | Status: DC | PRN
Start: 2024-02-09 — End: 2024-02-09
  Administered 2024-02-09 (×3): 4 mg via INTRAVENOUS
  Administered 2024-02-09: 8 mg via INTRAVENOUS
  Administered 2024-02-09: 4 mg via INTRAVENOUS

## 2024-02-09 MED ORDER — ONDANSETRON HCL 4 MG/2ML IJ SOLN
INTRAMUSCULAR | Status: DC | PRN
Start: 2024-02-09 — End: 2024-02-09
  Administered 2024-02-09: 4 mg via INTRAVENOUS

## 2024-02-09 MED ORDER — EPHEDRINE SULFATE-NACL 50-0.9 MG/10ML-% IV SOSY
PREFILLED_SYRINGE | INTRAVENOUS | Status: DC | PRN
  Administered 2024-02-09: 10 mg via INTRAVENOUS

## 2024-02-09 MED ORDER — ESTRADIOL 0.1 MG/GM VA CREA
TOPICAL_CREAM | VAGINAL | Status: DC | PRN
  Administered 2024-02-09: 1 via VAGINAL

## 2024-02-09 MED ORDER — SODIUM CHLORIDE 0.45 % IV SOLN: INTRAVENOUS | Status: DC

## 2024-02-09 MED ORDER — LACTATED RINGERS IR SOLN
Status: DC | PRN
  Administered 2024-02-09: 1000 mL

## 2024-02-09 MED ORDER — HYOSCYAMINE SULFATE 0.125 MG SL SUBL: 0.1250 mg | SUBLINGUAL_TABLET | SUBLINGUAL | Status: DC | PRN

## 2024-02-09 MED ORDER — ORAL CARE MOUTH RINSE: 15.0000 mL | Freq: Once | OROMUCOSAL | Status: AC

## 2024-02-09 MED ORDER — BUPIVACAINE-EPINEPHRINE 0.25% -1:200000 IJ SOLN
INTRAMUSCULAR | Status: DC | PRN
  Administered 2024-02-09: 14 mL
  Administered 2024-02-09: 16 mL

## 2024-02-09 MED ORDER — BUPIVACAINE LIPOSOME 1.3 % IJ SUSP
INTRAMUSCULAR | Status: AC
  Filled 2024-02-09: qty 20

## 2024-02-09 MED ORDER — FENTANYL CITRATE (PF) 100 MCG/2ML IJ SOLN
INTRAMUSCULAR | Status: AC
  Filled 2024-02-09: qty 2

## 2024-02-09 MED ORDER — HYDROMORPHONE HCL 1 MG/ML IJ SOLN
INTRAMUSCULAR | Status: AC
  Filled 2024-02-09: qty 1

## 2024-02-09 MED ORDER — ESTRADIOL 0.1 MG/GM VA CREA: 1.0000 | TOPICAL_CREAM | VAGINAL | 1 refills | Status: AC

## 2024-02-09 MED ORDER — DEXAMETHASONE SODIUM PHOSPHATE 10 MG/ML IJ SOLN
INTRAMUSCULAR | Status: AC
  Filled 2024-02-09: qty 1

## 2024-02-09 MED ORDER — POLYETHYLENE GLYCOL 3350 17 GM/SCOOP PO POWD: 238.0000 g | Freq: Once | ORAL | Status: DC

## 2024-02-09 MED ORDER — LATANOPROST 0.005 % OP SOLN
1.0000 [drp] | Freq: Every day | OPHTHALMIC | Status: DC
  Administered 2024-02-09: 1 [drp] via OPHTHALMIC
  Filled 2024-02-09: qty 2.5

## 2024-02-09 MED ORDER — ONDANSETRON HCL 4 MG/2ML IJ SOLN
INTRAMUSCULAR | Status: AC
  Filled 2024-02-09: qty 2

## 2024-02-09 MED ORDER — ONDANSETRON HCL 4 MG/2ML IJ SOLN
4.0000 mg | INTRAMUSCULAR | Status: DC | PRN
  Administered 2024-02-09: 4 mg via INTRAVENOUS
  Filled 2024-02-09: qty 2

## 2024-02-09 MED ORDER — DOCUSATE SODIUM 100 MG PO CAPS: 100.0000 mg | ORAL_CAPSULE | Freq: Two times a day (BID) | ORAL | Status: AC

## 2024-02-09 MED ORDER — TRAMADOL HCL 50 MG PO TABS: 50.0000 mg | ORAL_TABLET | Freq: Four times a day (QID) | ORAL | 0 refills | Status: AC | PRN

## 2024-02-09 MED ORDER — HYDROMORPHONE HCL 1 MG/ML IJ SOLN: 0.5000 mg | INTRAMUSCULAR | Status: DC | PRN

## 2024-02-09 MED ORDER — BUPIVACAINE LIPOSOME 1.3 % IJ SUSP
INTRAMUSCULAR | Status: DC | PRN
  Administered 2024-02-09: 20 mL

## 2024-02-09 MED ORDER — DORZOLAMIDE HCL-TIMOLOL MAL 2-0.5 % OP SOLN
1.0000 [drp] | OPHTHALMIC | Status: DC
  Administered 2024-02-09 – 2024-02-10 (×2): 1 [drp] via OPHTHALMIC
  Filled 2024-02-09: qty 10

## 2024-02-09 MED ORDER — ACETAMINOPHEN 500 MG PO TABS
1000.0000 mg | ORAL_TABLET | Freq: Once | ORAL | Status: AC
  Administered 2024-02-09: 1000 mg via ORAL
  Filled 2024-02-09: qty 2

## 2024-02-09 SURGICAL SUPPLY — 67 items
BAG COUNTER SPONGE SURGICOUNT (BAG) IMPLANT
BAG URINE DRAIN 2000ML AR STRL (UROLOGICAL SUPPLIES) IMPLANT
BRIEF MESH DISP LRG (UNDERPADS AND DIAPERS) IMPLANT
CATH FOLEY 2WAY SLVR 5CC 16FR (CATHETERS) ×1 IMPLANT
CHLORAPREP W/TINT 26 (MISCELLANEOUS) ×1 IMPLANT
CLIP LIGATING HEM O LOK PURPLE (MISCELLANEOUS) ×1 IMPLANT
CLIP LIGATING HEMO LOK XL GOLD (MISCELLANEOUS) IMPLANT
CLIP LIGATING HEMO O LOK GREEN (MISCELLANEOUS) IMPLANT
CLIP SUT LAPRA TY ABSORB (SUTURE) IMPLANT
COVER BACK TABLE 60X90IN (DRAPES) ×1 IMPLANT
COVER SURGICAL LIGHT HANDLE (MISCELLANEOUS) ×1 IMPLANT
COVER TIP SHEARS 8 DVNC (MISCELLANEOUS) ×1 IMPLANT
DERMABOND ADVANCED .7 DNX12 (GAUZE/BANDAGES/DRESSINGS) ×1 IMPLANT
DRAPE ARM DVNC X/XI (DISPOSABLE) ×4 IMPLANT
DRAPE COLUMN DVNC XI (DISPOSABLE) ×1 IMPLANT
DRAPE INCISE IOBAN 66X45 STRL (DRAPES) ×1 IMPLANT
DRAPE SHEET LG 3/4 BI-LAMINATE (DRAPES) ×2 IMPLANT
DRAPE SURG IRRIG POUCH 19X23 (DRAPES) ×1 IMPLANT
DRIVER NDL LRG 8 DVNC XI (INSTRUMENTS) ×2 IMPLANT
DRIVER NDLE LRG 8 DVNC XI (INSTRUMENTS) ×2 IMPLANT
ELECT PENCIL ROCKER SW 15FT (MISCELLANEOUS) ×1 IMPLANT
ELECT REM PT RETURN 15FT ADLT (MISCELLANEOUS) ×1 IMPLANT
FORCEPS BPLR LNG DVNC XI (INSTRUMENTS) ×1 IMPLANT
FORCEPS PROGRASP DVNC XI (FORCEP) ×1 IMPLANT
FORCEPS TENACULUM DVNC XI (FORCEP) ×1 IMPLANT
GAUZE 4X4 16PLY ~~LOC~~+RFID DBL (SPONGE) IMPLANT
GLOVE BIO SURGEON STRL SZ 6.5 (GLOVE) ×1 IMPLANT
GLOVE SURG LX STRL 7.5 STRW (GLOVE) ×2 IMPLANT
GOWN STRL REUS W/ TWL XL LVL3 (GOWN DISPOSABLE) ×2 IMPLANT
GOWN STRL SURGICAL XL XLNG (GOWN DISPOSABLE) ×1 IMPLANT
HOLDER FOLEY CATH W/STRAP (MISCELLANEOUS) ×1 IMPLANT
IRRIGATION SUCT STRKRFLW 2 WTP (MISCELLANEOUS) ×1 IMPLANT
KIT BASIN OR (CUSTOM PROCEDURE TRAY) ×1 IMPLANT
KIT TURNOVER KIT A (KITS) ×1 IMPLANT
MANIPULATOR UTERINE 4.5 ZUMI (MISCELLANEOUS) IMPLANT
MARKER SKIN DUAL TIP RULER LAB (MISCELLANEOUS) ×1 IMPLANT
MESH Y UPSYLON VAGINAL (Mesh General) IMPLANT
OCCLUDER COLPOPNEUMO (BALLOONS) IMPLANT
PACKING VAGINAL (PACKING) IMPLANT
PAD OB MATERNITY 11 LF (PERSONAL CARE ITEMS) IMPLANT
PAD POSITIONING PINK XL (MISCELLANEOUS) ×1 IMPLANT
SCISSORS MNPLR CVD DVNC XI (INSTRUMENTS) ×1 IMPLANT
SCRUB CHG 4% DYNA-HEX 4OZ (MISCELLANEOUS) IMPLANT
SEAL UNIV 5-12 XI (MISCELLANEOUS) ×4 IMPLANT
SET IRRIG Y TYPE TUR BLADDER L (SET/KITS/TRAYS/PACK) IMPLANT
SET TUBE SMOKE EVAC HIGH FLOW (TUBING) ×1 IMPLANT
SHEET LAVH (DRAPES) ×1 IMPLANT
SOL PREP POV-IOD 4OZ 10% (MISCELLANEOUS) IMPLANT
SOLUTION ELECTROSURG ANTI STCK (MISCELLANEOUS) ×1 IMPLANT
SURGILUBE 2OZ TUBE FLIPTOP (MISCELLANEOUS) IMPLANT
SUT MNCRL AB 4-0 PS2 18 (SUTURE) ×2 IMPLANT
SUT MON AB 3-0 SH27 (SUTURE) IMPLANT
SUT PROLENE 2 0 CT 1 (SUTURE) ×1 IMPLANT
SUT STRATA PDS 2-0 23 CT-1 (SUTURE) IMPLANT
SUT STRATAFIX SPIRAL PDS3-0 (SUTURE) IMPLANT
SUT VIC AB 0 CT1 27XBRD ANTBC (SUTURE) ×1 IMPLANT
SUT VIC AB 2-0 SH 27XBRD (SUTURE) ×5 IMPLANT
SUT VIC AB 3-0 SH 27X BRD (SUTURE) ×1 IMPLANT
SUT VICRYL 0 UR6 27IN ABS (SUTURE) ×1 IMPLANT
SUTURE STRATFX SPIRAL 3-0 PDS+ (SUTURE) ×1 IMPLANT
SYR 50ML LL SCALE MARK (SYRINGE) IMPLANT
SYSTEM BAG RETRIEVAL 10MM (BASKET) IMPLANT
TOWEL OR 17X26 10 PK STRL BLUE (TOWEL DISPOSABLE) ×1 IMPLANT
TRAY LAPAROSCOPIC (CUSTOM PROCEDURE TRAY) ×1 IMPLANT
TROCAR Z THREAD OPTICAL 12X100 (TROCAR) ×1 IMPLANT
TROCAR Z-THREAD FIOS 5X100MM (TROCAR) ×1 IMPLANT
WATER STERILE IRR 1000ML POUR (IV SOLUTION) ×1 IMPLANT

## 2024-02-09 NOTE — Anesthesia Procedure Notes (Signed)
 Procedure Name: Intubation Date/Time: 02/09/2024 7:32 AM  Performed by: Obadiah Reyes BROCKS, CRNAPre-anesthesia Checklist: Patient identified, Emergency Drugs available, Suction available and Patient being monitored Patient Re-evaluated:Patient Re-evaluated prior to induction Oxygen Delivery Method: Circle System Utilized Preoxygenation: Pre-oxygenation with 100% oxygen Induction Type: IV induction Ventilation: Mask ventilation without difficulty Laryngoscope Size: Miller and 2 Grade View: Grade I Tube type: Oral Number of attempts: 1 Airway Equipment and Method: Stylet and Oral airway Placement Confirmation: ETT inserted through vocal cords under direct vision, positive ETCO2 and breath sounds checked- equal and bilateral Secured at: 21 cm Tube secured with: Tape Dental Injury: Teeth and Oropharynx as per pre-operative assessment

## 2024-02-09 NOTE — Discharge Instructions (Signed)

## 2024-02-09 NOTE — Op Note (Signed)
 Preoperative diagnosis:   Pelvic organ prolapse  Postoperative diagnosis:   Same  Procedure: Robotic-assisted laparoscopic supracervical hysterectomy and  bilateral salpingo-oopherectomy Robotic-assisted laparoscopic sacrocolpopexy  Surgeon: Morene MICAEL Salines, MD First assistant: Alan Hammonds, PA-C  An assistant was required for this surgical procedure.  The duties of the assistant included but were not limited to suctioning, passing suture, camera manipulation, retraction. This procedure would not be able to be performed without an Geophysicist/field seismologist.   Anesthesia: General  Complications: None  Intraoperative findings:  [employer] Upsilon Y mesh used for the sacrocolpopexy.  EBL: 40 mL  Specimens: Uterus and proximal cervix, bilateral fallopian tubes and ovaries  Indication: Erica Ingram is a 78 y.o. female patient with symptomatic pelvic organ prolapse.    After reviewing the management options for treatment, she elected to proceed with the above surgical procedure(s). We have discussed the potential benefits and risks of the procedure, side effects of the proposed treatment, the likelihood of the patient achieving the goals of the procedure, and any potential problems that might occur during the procedure or recuperation. Informed consent has been obtained.  Description of procedure:  The patient was taken to the operating room and general anesthesia was induced.  The patient was placed in the dorsal lithotomy position, prepped and draped in the usual sterile fashion, and preoperative antibiotics were administered. A preoperative time-out was performed.    A Foley catheter was then placed and placed to gravity drainage. I then made a periumbilical incision carrying the dissection down to the patient's fascia with electrocautery.  Once to the fascia, the fascia was incised and a small puncture hole made in the peritoneum to allow passage of a 8mm port.   The abdomen was  insufflated and the remaining ports placed under digital guidance.  2 ports were placed lateral to the umbilicus on the right proximally 10 cm apart.  The most lateral port was approximately 3 cm above the anterior iliac spine.  2 additional ports were placed in the patient's right side in comparable positions to the most lateral port on the right was a 12 mm port.the robot was then docked at an angle from the leg obliquely along the side of the left leg.  We then began our surgery by cleaning up some of the pelvic adhesions to the small bowel and colon.  Once this was completed I started dissecting at the sacral promontory located 3 cm medial to the location where the ureter crosses over the right iliac vessels at the pelvic brim. The posterior peritoneum was incised and the sacral prominence cleared off an area taking care to avoid the middle sacral vessels and the iliac branches.  I then created a posterior peritoneal tunnel starting at the sacral promontory and tunneling down the right pelvic sidewall down into the pelvis breaking back through the posterior peritoneum around the vesico-vaginal junction posteriorly.  I then continued the posterior dissection retracting down on the rectum and finding the avascular plane between the posterior vaginal wall and the rectum.  I carried this dissection down as far as I could to along the area of the perineal body.  Then focused my attention to the uterus and hysterectomy.  I first started by taking the right round ligament with a series of by polar cautery.  I then dissected the anterior leaf of the broad ligament slightly more proximal and then distally down across the anterior mucosa to the salpinx and the internal cervical os.  I then took of  the uterine ovarian ligament on the right and dissected free the right salpinx. Once the anterior leaf of the broad ligament had been completely dissected on the patient's right and a small bladder flap had been created  anteriorly attention was turned to the left side where a similar dissection was carried out. I then turned my attention to the anterior plane between the anterior vaginal wall and the bladder.  I was able to obtain access to the avascular plane and with a combination of both monopolar cautery and blunt dissection was able to get down to the bladder neck.  I then turned my attention back to the patient's uterus and skeletonized the right uterine artery and vein and then took this with a series of bipolar moves.  I then performed a similar uterus pedicle ligation on the left.  At his point I was able to identify the patient's cervix and came through supracervical with monopolar cautery once the uterus was freed from all its attachments it was pushed into the left paracolic gutter and our attention was turned to placing the mesh.  Mesh was measured at approximately 9.0 cm anteriorly and 12 cm posteriorly and I cut this on the back table.  The mesh was then placed into the patient's abdomen through the assistant port and the anterior leaf was secured down onto the anterior vaginal wall with the apex at the bladder neck.  The posterior leaf was then secured down on the posterior vaginal wall.  These were sewn down with 2-0 Vicryl.  Between 6 and 8 were done on each side.  At this point I then went back to the previously dissected sacral promontory and posterior peritoneal tunnel and inserted a instrument through the tunnel and grasped the end of the mesh at the vaginal cuff and pull it up to the sacrum.  I then checked to ensure that the sacral mesh was not too tight by performing a vaginal exam.  I then secured the sacral leg of the mesh using a 0 Prolene.  I then reapproximated the posterior peritoneum with a 2-0 Vicryl in a running fashion around the sacral promontory.  The pelvic peritoneum was closed using a pursestring.  A small Endo Catch bag was then gently passed through the assistant port and the uterus was  placed in the bag.  The bag was then brought out through the camera port once the trochars were removed.  We then made a slightly larger extraction incision to remove the uterus.  The fascia was then closed with 0 Vicryl in a figure-of-eight fashion.  The skin was closed with 4-0 Monocryl's.  Dermabond was applied to the incision and exparel  injected into the incisions.  Estrace impregnated packing was then placed into her vagina which will be left in overnight.  The patient was subsequently extubated and returned to the PACU in excellent condition.

## 2024-02-09 NOTE — Anesthesia Postprocedure Evaluation (Signed)
 Anesthesia Post Note  Patient: Erica Ingram  Procedure(s) Performed: HYSTERECTOMY, SUPRACERVICAL, ROBOT-ASSISTED, LAPAROSCOPIC, WITH BILATERAL SALPINGECTOMY (Bilateral: Vagina )     Patient location during evaluation: PACU Anesthesia Type: General Level of consciousness: sedated and patient cooperative Pain management: pain level controlled Vital Signs Assessment: post-procedure vital signs reviewed and stable Respiratory status: spontaneous breathing Cardiovascular status: stable Anesthetic complications: no   No notable events documented.  Last Vitals:  Vitals:   02/09/24 1447 02/09/24 1544  BP: 103/60 119/63  Pulse: (!) 54 (!) 58  Resp: 18 17  Temp:    SpO2: 97% 99%    Last Pain:  Vitals:   02/09/24 1358  TempSrc:   PainSc: 10-Worst pain ever                 Norleen Pope

## 2024-02-09 NOTE — Plan of Care (Signed)

## 2024-02-09 NOTE — Transfer of Care (Signed)
 Immediate Anesthesia Transfer of Care Note  Patient: Erica Ingram  Procedure(s) Performed: HYSTERECTOMY, SUPRACERVICAL, ROBOT-ASSISTED, LAPAROSCOPIC, WITH BILATERAL SALPINGECTOMY (Bilateral: Vagina )  Patient Location: PACU  Anesthesia Type:General  Level of Consciousness: awake, confused, and responds to stimulation  Airway & Oxygen Therapy: Patient Spontanous Breathing and Patient connected to face mask oxygen  Post-op Assessment: Report given to RN and Post -op Vital signs reviewed and stable  Post vital signs: Reviewed and stable  Last Vitals:  Vitals Value Taken Time  BP 145/76 02/09/24 11:15  Temp    Pulse 58 02/09/24 11:16  Resp 14 02/09/24 11:17  SpO2 96 % 02/09/24 11:16  Vitals shown include unfiled device data.  Last Pain:  Vitals:   02/09/24 0631  TempSrc: Oral  PainSc:          Complications: No notable events documented.

## 2024-02-09 NOTE — Interval H&P Note (Signed)
 History and Physical Interval Note:  02/09/2024 7:18 AM  Erica Ingram  has presented today for surgery, with the diagnosis of PELVIC FLOOR PROLAPSE, STRESS URINIARY INCONTINENCE.  The various methods of treatment have been discussed with the patient and family. After consideration of risks, benefits and other options for treatment, the patient has consented to  Procedure(s) with comments: HYSTERECTOMY, SUPRACERVICAL, ROBOT-ASSISTED, LAPAROSCOPIC, WITH BILATERAL SALPINGECTOMY (Bilateral) - ROBOTIC SUPRACERVICAL HYSTERECTOMY, BILATERAL SALPINGO-OOPHORECTOMY, SACROCOLPOPEXY, CYSTOSCOPY WITH MID-URETHRAL SLING as a surgical intervention.  The patient's history has been reviewed, patient examined, no change in status, stable for surgery.  I have reviewed the patient's chart and labs.  Questions were answered to the patient's satisfaction.     Erica Ingram

## 2024-02-10 ENCOUNTER — Encounter (HOSPITAL_COMMUNITY): Payer: Self-pay | Admitting: Urology

## 2024-02-10 DIAGNOSIS — N819 Female genital prolapse, unspecified: Secondary | ICD-10-CM | POA: Diagnosis not present

## 2024-02-10 LAB — BASIC METABOLIC PANEL WITH GFR
Anion gap: 8 (ref 5–15)
BUN: 14 mg/dL (ref 8–23)
CO2: 24 mmol/L (ref 22–32)
Calcium: 8.7 mg/dL — ABNORMAL LOW (ref 8.9–10.3)
Chloride: 100 mmol/L (ref 98–111)
Creatinine, Ser: 1.01 mg/dL — ABNORMAL HIGH (ref 0.44–1.00)
GFR, Estimated: 57 mL/min — ABNORMAL LOW (ref >60)
Glucose, Bld: 113 mg/dL — ABNORMAL HIGH (ref 70–99)
Potassium: 4.3 mmol/L (ref 3.5–5.1)
Sodium: 132 mmol/L — ABNORMAL LOW (ref 135–145)

## 2024-02-10 LAB — SURGICAL PATHOLOGY

## 2024-02-10 LAB — HEMOGLOBIN AND HEMATOCRIT, BLOOD
HCT: 33 % — ABNORMAL LOW (ref 36.0–46.0)
Hemoglobin: 10.4 g/dL — ABNORMAL LOW (ref 12.0–15.0)

## 2024-02-10 NOTE — Progress Notes (Signed)
 Discharge instructions were reviewed with the patient. Her daughter was present during discharge educations. She denied questions, or concerns at this time. Incisions were clean dry and intact, no edema, exudate, or erythema present. IV removed, site is clean dry and intact.

## 2024-02-10 NOTE — Progress Notes (Signed)
 At 0620 on 02/10/2024,   Removed patient's foley per order, tolerated well.   Discussed with patient and daughter about also removing vaginal packing per order specified for '02/10/2024 at 0500'. Patient hesitant and expressed interest in HCP seeing her prior to or taking out personally to assess. Daughter understood and was agreeable with her mother.

## 2024-02-10 NOTE — Discharge Summary (Addendum)
 Date of admission: 02/09/2024  Date of discharge: 02/10/2024  Admission diagnosis: pelvic organ prolapse  Discharge diagnosis: same  Secondary diagnoses:  Patient Active Problem List   Diagnosis Date Noted   Cystocele with prolapse 02/09/2024   Stroke (HCC) 03/05/2022   Thrombopenia (HCC) 03/05/2022   Elevated fasting blood sugar 01/28/2017   Hyperlipidemia 01/28/2017   Hypothyroidism 01/28/2017    Procedures performed: Procedure(s): HYSTERECTOMY, SUPRACERVICAL, ROBOT-ASSISTED, LAPAROSCOPIC, WITH BILATERAL SALPINGECTOMY  History and Physical: For full details, please see admission history and physical. Briefly, Erica Ingram is a 78 y.o. year old patient with cystoscele.   Hospital Course: Patient tolerated the procedure well.  She was then transferred to the floor after an uneventful PACU stay.  Her hospital course was uncomplicated.  On POD#1 she had met discharge criteria: was eating a regular diet, was up and ambulating independently,  pain was well controlled, was voiding without a catheter, and was ready to for discharge.  PE: NAD Vitals:   02/09/24 1544 02/09/24 2040 02/10/24 0209 02/10/24 0606  BP: 119/63 131/65 (!) 119/59 125/61  Pulse: (!) 58 60 (!) 53 (!) 58  Resp: 17 18 18 18   Temp:  (!) 97.3 F (36.3 C) 97.6 F (36.4 C) 98 F (36.7 C)  TempSrc:  Oral Oral Oral  SpO2: 99% 99% 99% 99%  Weight:        Intake/Output Summary (Last 24 hours) at 02/10/2024 0912 Last data filed at 02/10/2024 0734 Gross per 24 hour  Intake 2088.2 ml  Output 1690 ml  Net 398.2 ml  Abdomen is soft, incisions c/d/I Extremities symmetric Packing out Foley out   Laboratory values:  Recent Labs    02/09/24 1155 02/10/24 0431  HGB 10.9* 10.4*  HCT 34.8* 33.0*   Recent Labs    02/10/24 0431  NA 132*  K 4.3  CL 100  CO2 24  GLUCOSE 113*  BUN 14  CREATININE 1.01*  CALCIUM  8.7*   No results for input(s): LABPT, INR in the last 72 hours. No results for input(s):  LABURIN in the last 72 hours. Results for orders placed or performed during the hospital encounter of 02/04/24  Urine Culture     Status: Abnormal   Collection Time: 02/04/24 11:43 AM   Specimen: Urine, Clean Catch  Result Value Ref Range Status   Specimen Description   Final    URINE, CLEAN CATCH Performed at [facility], [address], [place], [place] [address]    Special Requests   Final    NONE Performed at [facility], [address], [place], [place] [address]    Culture (A)  Final    20,000 COLONIES/mL ESCHERICHIA COLI Confirmed Extended Spectrum Beta-Lactamase Producer (ESBL).  In bloodstream infections from ESBL organisms, carbapenems are preferred over piperacillin/tazobactam. They are shown to have a lower risk of mortality.    Report Status 02/06/2024 FINAL  Final   Organism ID, Bacteria ESCHERICHIA COLI (A)  Final      Susceptibility   Escherichia coli - MIC*    AMPICILLIN >=32 RESISTANT Resistant     CEFAZOLIN >=64 RESISTANT Resistant     CEFEPIME 16 RESISTANT Resistant     CEFTRIAXONE >=64 RESISTANT Resistant     CIPROFLOXACIN >=4 RESISTANT Resistant     GENTAMICIN <=1 SENSITIVE Sensitive     IMIPENEM <=0.25 SENSITIVE Sensitive     NITROFURANTOIN <=16 SENSITIVE Sensitive     TRIMETH/SULFA <=20 SENSITIVE Sensitive     AMPICILLIN/SULBACTAM >=32 RESISTANT  Resistant     PIP/TAZO <=4 SENSITIVE Sensitive ug/mL    * 20,000 COLONIES/mL ESCHERICHIA COLI    Disposition: Home  Discharge instruction: The patient was instructed to be ambulatory but told to refrain from heavy lifting, strenuous activity, or driving.   Discharge medications:  Allergies as of 02/10/2024   No Known Allergies      Medication List     STOP taking these medications    cyanocobalamin 1000 MCG tablet Commonly known as: VITAMIN B12   ibuprofen 200 MG tablet Commonly known as: ADVIL   Multi-Vitamins Tabs   OVER THE COUNTER MEDICATION   St  Johns Wort 300 MG Caps   vitamin E 180 MG (400 UNITS) capsule       TAKE these medications    acetaminophen  500 MG tablet Commonly known as: TYLENOL  Take 500 mg by mouth every 6 (six) hours as needed for mild pain, moderate pain, fever or headache.   atorvastatin  80 MG tablet Commonly known as: LIPITOR Take 1 tablet by mouth daily.   brimonidine  0.2 % ophthalmic solution Commonly known as: ALPHAGAN  Place 1 drop into both eyes in the morning and at bedtime.   clopidogrel  75 MG tablet Commonly known as: PLAVIX  Take 1 tablet by mouth daily.   docusate sodium  100 MG capsule Commonly known as: COLACE Take 1 capsule (100 mg total) by mouth 2 (two) times daily.   dorzolamide -timolol  2-0.5 % ophthalmic solution Commonly known as: COSOPT  Place 1 drop into both eyes 2 (two) times daily in the am and at bedtime.SABRA   estradiol 0.1 MG/GM vaginal cream Commonly known as: ESTRACE Place 1 Applicatorful vaginally 3 (three) times a week.   fexofenadine 180 MG tablet Commonly known as: ALLEGRA Take 180 mg by mouth daily.   fluticasone  50 MCG/ACT nasal spray Commonly known as: FLONASE  Place 1 spray into both nostrils daily as needed for allergies.   latanoprost  0.005 % ophthalmic solution Commonly known as: XALATAN  Place 1 drop into both eyes at bedtime.   levothyroxine  112 MCG tablet Commonly known as: SYNTHROID  Take 112 mcg by mouth daily before breakfast.   sulfamethoxazole-trimethoprim 800-160 MG tablet Commonly known as: BACTRIM DS Take 1 tablet by mouth 2 (two) times daily.   traMADol 50 MG tablet Commonly known as: Ultram Take 1-2 tablets (50-100 mg total) by mouth every 6 (six) hours as needed for moderate pain (pain score 4-6) or severe pain (pain score 7-10).        Followup:   Follow-up Information     Buck Rodena BIRCH, NP Follow up on 02/23/2024.   Specialty: Urology Why: at 2:30 Contact information: [address] [place] [place]  [address] [phone]

## 2024-02-10 NOTE — Progress Notes (Signed)
   02/10/24 0856  TOC Brief Assessment  Insurance and Status Reviewed  Patient has primary care physician Yes  Home environment has been reviewed single family home  Prior level of function: independent  Prior/Current Home Services No current home services  Social Drivers of Health Review SDOH reviewed no interventions necessary  Readmission risk has been reviewed Yes  Transition of care needs transition of care needs identified, TOC will continue to follow    Heather Saltness, MSW, LCSW 02/10/2024 8:56 AM
